# Patient Record
Sex: Male | Born: 2000 | Race: Black or African American | Hispanic: No | Marital: Single | Smoking: Current every day smoker
Health system: Southern US, Community
[De-identification: ages and names within clinical notes are randomized; demographics above are authoritative.]

## PROBLEM LIST (undated history)

## (undated) DIAGNOSIS — Z789 Other specified health status: Secondary | ICD-10-CM

## (undated) DIAGNOSIS — Z720 Tobacco use: Secondary | ICD-10-CM

## (undated) DIAGNOSIS — J45909 Unspecified asthma, uncomplicated: Secondary | ICD-10-CM

## (undated) DIAGNOSIS — F1090 Alcohol use, unspecified, uncomplicated: Secondary | ICD-10-CM

---
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
## 2019-11-18 ENCOUNTER — Other Ambulatory Visit: Payer: Self-pay

## 2019-11-18 ENCOUNTER — Emergency Department
Admission: EM | Admit: 2019-11-18 | Discharge: 2019-11-18 | Disposition: A | Discharge status: Home / Self Care | Payer: Medicaid Other | Attending: Emergency Medicine | Admitting: Emergency Medicine

## 2019-11-18 ENCOUNTER — Encounter: Payer: Self-pay | Admitting: Emergency Medicine

## 2019-11-18 DIAGNOSIS — F17210 Nicotine dependence, cigarettes, uncomplicated: Secondary | ICD-10-CM | POA: Insufficient documentation

## 2019-11-18 DIAGNOSIS — F10129 Alcohol abuse with intoxication, unspecified: Secondary | ICD-10-CM | POA: Insufficient documentation

## 2019-11-18 DIAGNOSIS — T402X1A Poisoning by other opioids, accidental (unintentional), initial encounter: Secondary | ICD-10-CM | POA: Diagnosis not present

## 2019-11-18 DIAGNOSIS — F10929 Alcohol use, unspecified with intoxication, unspecified: Secondary | ICD-10-CM

## 2019-11-18 DIAGNOSIS — T40601A Poisoning by unspecified narcotics, accidental (unintentional), initial encounter: Secondary | ICD-10-CM

## 2019-11-18 LAB — CBC
HCT: 43.4 % (ref 39.0–52.0)
Hemoglobin: 15.2 g/dL (ref 13.0–17.0)
MCH: 32.3 pg (ref 26.0–34.0)
MCHC: 35 g/dL (ref 30.0–36.0)
MCV: 92.1 fL (ref 80.0–100.0)
Platelets: 386 10*3/uL (ref 150–400)
RBC: 4.71 MIL/uL (ref 4.22–5.81)
RDW: 13.1 % (ref 11.5–15.5)
WBC: 20.6 10*3/uL — ABNORMAL HIGH (ref 4.0–10.5)
nRBC: 0 % (ref 0.0–0.2)

## 2019-11-18 LAB — COMPREHENSIVE METABOLIC PANEL
ALT: 23 U/L (ref 0–44)
AST: 29 U/L (ref 15–41)
Albumin: 4.6 g/dL (ref 3.5–5.0)
Alkaline Phosphatase: 70 U/L (ref 38–126)
Anion gap: 17 — ABNORMAL HIGH (ref 5–15)
BUN: 11 mg/dL (ref 6–20)
CO2: 20 mmol/L — ABNORMAL LOW (ref 22–32)
Calcium: 8.8 mg/dL — ABNORMAL LOW (ref 8.9–10.3)
Chloride: 105 mmol/L (ref 98–111)
Creatinine, Ser: 0.98 mg/dL (ref 0.61–1.24)
GFR calc Af Amer: >60 mL/min (ref >60)
GFR calc non Af Amer: >60 mL/min (ref >60)
Glucose, Bld: 84 mg/dL (ref 70–99)
Potassium: 3.6 mmol/L (ref 3.5–5.1)
Sodium: 142 mmol/L (ref 135–145)
Total Bilirubin: 0.7 mg/dL (ref 0.3–1.2)
Total Protein: 8.2 g/dL — ABNORMAL HIGH (ref 6.5–8.1)

## 2019-11-18 LAB — URINE DRUG SCREEN, QUALITATIVE (ARMC ONLY)
Amphetamines, Ur Screen: NOT DETECTED
Barbiturates, Ur Screen: NOT DETECTED
Benzodiazepine, Ur Scrn: NOT DETECTED
Cannabinoid 50 Ng, Ur ~~LOC~~: POSITIVE — AB
Cocaine Metabolite,Ur ~~LOC~~: NOT DETECTED
MDMA (Ecstasy)Ur Screen: NOT DETECTED
Methadone Scn, Ur: NOT DETECTED
Opiate, Ur Screen: NOT DETECTED
Phencyclidine (PCP) Ur S: NOT DETECTED
Tricyclic, Ur Screen: NOT DETECTED

## 2019-11-18 LAB — ETHANOL: Alcohol, Ethyl (B): 233 mg/dL — ABNORMAL HIGH (ref <10)

## 2019-11-18 LAB — ACETAMINOPHEN LEVEL: Acetaminophen (Tylenol), Serum: <10 ug/mL — ABNORMAL LOW (ref 10–30)

## 2019-11-18 LAB — SALICYLATE LEVEL: Salicylate Lvl: <7 mg/dL — ABNORMAL LOW (ref 7.0–30.0)

## 2019-11-18 NOTE — ED Notes (Signed)
PT  Discharged to police custody.;
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
## 2019-11-18 NOTE — ED Provider Notes (Signed)
[facility] Emergency Department Provider Note  ____________________________________________   First MD Initiated Contact with Patient 11/18/19 2332     (approximate)  I have reviewed the triage vital signs and the nursing notes.   HISTORY  Chief Complaint Medical Clearance    HPI Connor Williams is a 19 y.o. male presents to the emergency department in police custody after being found "passed out" on the reportedly by a trash can.  911 administered Narcan and the patient became responsive.  Patient is now alert and oriented and admits to taking what he thought to be a "Percocet 30".  Patient denies any preceding trauma.  Patient denies any complaints at present stating "I feel fine".        History reviewed. No pertinent past medical history.  Patient Active Problem List   Diagnosis Date Noted  . Altered mental status 09/23/2016    History reviewed. No pertinent surgical history.  Prior to Admission medications   Medication Sig Start Date End Date Taking? Authorizing Provider  ibuprofen (ADVIL,MOTRIN) 400 MG tablet Take 1 tablet (400 mg total) by mouth every 6 (six) hours as needed. Patient not taking: Reported on 09/23/2016 06/10/16   Pauline Aus, PA-C  magic mouthwash w/lidocaine SOLN Take 5 mLs by mouth 3 (three) times daily as needed for mouth pain. Swish and spit, do not swallow Patient not taking: Reported on 09/23/2016 06/10/16   Pauline Aus, PA-C  pseudoephedrine (SUDAFED) 60 MG tablet Take 1 tablet (60 mg total) by mouth every 6 (six) hours as needed for congestion. Patient not taking: Reported on 09/23/2016 06/10/16   Pauline Aus, PA-C    Allergies Patient has no known allergies.  Family History  Problem Relation Age of Onset  . Hypothyroidism Mother   . Hypertension Mother   . Alcohol abuse Father     Social History Social History   Tobacco Use  . Smoking status: Current Every Day Smoker    Packs/day: 1.00    Years: 3.00     Pack years: 3.00    Types: Cigarettes  . Smokeless tobacco: Never Used  Substance Use Topics  . Alcohol use: Yes  . Drug use: Yes    Types: Marijuana    Review of Systems Constitutional: No fever/chills Eyes: No visual changes. ENT: No sore throat. Cardiovascular: Denies chest pain. Respiratory: Denies shortness of breath. Gastrointestinal: No abdominal pain.  No nausea, no vomiting.  No diarrhea.  No constipation. Genitourinary: Negative for dysuria. Musculoskeletal: Negative for neck pain.  Negative for back pain. Integumentary: Negative for rash. Neurological: Negative for headaches, focal weakness or numbness. Psychiatric: Positive for opiate use ____________________________________________   PHYSICAL EXAM:  VITAL SIGNS: ED Triage Vitals  Enc Vitals Group     BP 11/18/19 1957 (!) 119/61     Pulse Rate 11/18/19 1957 91     Resp 11/18/19 1957 16     Temp 11/18/19 1957 98.7 F (37.1 C)     Temp Source 11/18/19 1957 Oral     SpO2 11/18/19 1957 97 %     Weight --      Height 11/18/19 2003 1.626 m (5\' 4" )     Head Circumference --      Peak Flow --      Pain Score 11/18/19 2003 0     Pain Loc --      Pain Edu? --      Excl. in GC? --     Constitutional: Alert and oriented.  Eyes:  Conjunctivae are normal.  Head: Atraumatic. Mouth/Throat: Patient is wearing a mask. Neck: No stridor.  No meningeal signs.   Cardiovascular: Normal rate, regular rhythm. Good peripheral circulation. Grossly normal heart sounds. Respiratory: Normal respiratory effort.  No retractions. Gastrointestinal: Soft and nontender. No distention.  Musculoskeletal: No lower extremity tenderness nor edema. No gross deformities of extremities. Neurologic:  Normal speech and language. No gross focal neurologic deficits are appreciated.  Skin:  Skin is warm, dry and intact. Psychiatric: Mood and affect are normal. Speech and behavior are normal.  ____________________________________________     LABS (all labs ordered are listed, but only abnormal results are displayed)  Labs Reviewed  COMPREHENSIVE METABOLIC PANEL - Abnormal; Notable for the following components:      Result Value   CO2 20 (*)    Calcium 8.8 (*)    Total Protein 8.2 (*)    Anion gap 17 (*)    All other components within normal limits  ETHANOL - Abnormal; Notable for the following components:   Alcohol, Ethyl (B) 233 (*)    All other components within normal limits  SALICYLATE LEVEL - Abnormal; Notable for the following components:   Salicylate Lvl <7.0 (*)    All other components within normal limits  ACETAMINOPHEN LEVEL - Abnormal; Notable for the following components:   Acetaminophen (Tylenol), Serum <10 (*)    All other components within normal limits  CBC - Abnormal; Notable for the following components:   WBC 20.6 (*)    All other components within normal limits  URINE DRUG SCREEN, QUALITATIVE ([facility] ONLY) - Abnormal; Notable for the following components:   Cannabinoid 50 Ng, Ur [place] POSITIVE (*)    All other components within normal limits    Procedures   ____________________________________________   INITIAL IMPRESSION / MDM / ASSESSMENT AND PLAN / ED COURSE  As part of my medical decision making, I reviewed the following data within the electronic MEDICAL RECORD NUMBER  19 year old male presented with above-stated history and physical exam following opiate overdose.  Patient denies any suicidal or homicidal ideation.  Laboratory data did reveal that patient had a EtOH level of 233.  Patient remained stable in the emergency department after many hours of observation and as such will be discharged in police custody. ____________________________________________  FINAL CLINICAL IMPRESSION(S) / ED DIAGNOSES  Final diagnoses:  Opiate overdose, accidental or unintentional, initial encounter (HCC)  Alcoholic intoxication with complication (HCC)     MEDICATIONS GIVEN DURING THIS  VISIT:  Medications - No data to display   ED Discharge Orders    None      *Please note:  Connor Williams was evaluated in Emergency Department on 11/18/2019 for the symptoms described in the history of present illness. He was evaluated in the context of the global COVID-19 pandemic, which necessitated consideration that the patient might be at risk for infection with the SARS-CoV-2 virus that causes COVID-19. Institutional protocols and algorithms that pertain to the evaluation of patients at risk for COVID-19 are in a state of rapid change based on information released by regulatory bodies including the CDC and federal and state organizations. These policies and algorithms were followed during the patient's care in the ED.  Some ED evaluations and interventions may be delayed as a result of limited staffing during and after the pandemic.*  Note:  This document was prepared using Dragon voice recognition software and may include unintentional dictation errors.   Darci Current, MD 11/18/19 2337

## 2020-01-20 ENCOUNTER — Other Ambulatory Visit: Payer: Self-pay

## 2020-01-20 ENCOUNTER — Emergency Department
Admission: EM | Admit: 2020-01-20 | Discharge: 2020-01-20 | Disposition: A | Discharge status: Home / Self Care | Payer: Medicaid Other | Attending: Emergency Medicine | Admitting: Emergency Medicine

## 2020-01-20 ENCOUNTER — Emergency Department: Payer: Medicaid Other

## 2020-01-20 DIAGNOSIS — J069 Acute upper respiratory infection, unspecified: Secondary | ICD-10-CM | POA: Diagnosis present

## 2020-01-20 DIAGNOSIS — J9801 Acute bronchospasm: Secondary | ICD-10-CM | POA: Diagnosis not present

## 2020-01-20 DIAGNOSIS — Z20822 Contact with and (suspected) exposure to covid-19: Secondary | ICD-10-CM | POA: Diagnosis not present

## 2020-01-20 DIAGNOSIS — Z7951 Long term (current) use of inhaled steroids: Secondary | ICD-10-CM | POA: Insufficient documentation

## 2020-01-20 DIAGNOSIS — F17210 Nicotine dependence, cigarettes, uncomplicated: Secondary | ICD-10-CM | POA: Diagnosis not present

## 2020-01-20 LAB — RESPIRATORY PANEL BY RT PCR (FLU A&B, COVID)
Influenza A by PCR: NEGATIVE
Influenza B by PCR: NEGATIVE
SARS Coronavirus 2 by RT PCR: NEGATIVE

## 2020-01-20 MED ORDER — METHYLPREDNISOLONE 4 MG PO TBPK: ORAL_TABLET | ORAL | 0 refills | Status: DC

## 2020-01-20 MED ORDER — BENZONATATE 100 MG PO CAPS: 100.0000 mg | ORAL_CAPSULE | Freq: Three times a day (TID) | ORAL | 0 refills | Status: DC | PRN

## 2020-01-20 MED ORDER — ALBUTEROL SULFATE HFA 108 (90 BASE) MCG/ACT IN AERS: 2.0000 | INHALATION_SPRAY | Freq: Four times a day (QID) | RESPIRATORY_TRACT | 2 refills | Status: DC | PRN

## 2020-01-20 NOTE — Discharge Instructions (Addendum)
Follow discharge care instruction take medication as directed.  Advised self quarantine pending results of COVID-19 test.  Test results will be available in the MyChart app later today.

## 2020-01-20 NOTE — ED Triage Notes (Signed)
Pt c/o cough with congestion and SOb for the past 2-3 nights, states he had same sx about 3 weeks ago and it went away

## 2020-01-20 NOTE — ED Provider Notes (Signed)
[facility] Emergency Department Provider Note   ____________________________________________   First MD Initiated Contact with Patient 01/20/20 1106     (approximate)  I have reviewed the triage vital signs and the nursing notes.   HISTORY  Chief Complaint URI    HPI Connor Williams is a 19 y.o. male patient presents with cough and congestion.  Patient also states shortness of breath secondary to mild wheezing.  Onset of complaint of 2 to 3 days ago.  Patient states similar complaint about 3 weeks ago went away without medical intervention.  Patient denies recent travel or known contact with COVID-19.  Patient not taken the vaccine.         History reviewed. No pertinent past medical history.  Patient Active Problem List   Diagnosis Date Noted  . Altered mental status 09/23/2016    History reviewed. No pertinent surgical history.  Prior to Admission medications   Medication Sig Start Date End Date Taking? Authorizing Provider  albuterol (VENTOLIN HFA) 108 (90 Base) MCG/ACT inhaler Inhale 2 puffs into the lungs every 6 (six) hours as needed for wheezing or shortness of breath. 01/20/20   Joni Reining, PA-C  benzonatate (TESSALON PERLES) 100 MG capsule Take 1 capsule (100 mg total) by mouth 3 (three) times daily as needed for cough. 01/20/20 01/19/21  Joni Reining, PA-C  ibuprofen (ADVIL,MOTRIN) 400 MG tablet Take 1 tablet (400 mg total) by mouth every 6 (six) hours as needed. Patient not taking: Reported on 09/23/2016 06/10/16   Pauline Aus, PA-C  magic mouthwash w/lidocaine SOLN Take 5 mLs by mouth 3 (three) times daily as needed for mouth pain. Swish and spit, do not swallow Patient not taking: Reported on 09/23/2016 06/10/16   Pauline Aus, PA-C  methylPREDNISolone (MEDROL DOSEPAK) 4 MG TBPK tablet Take Tapered dose as directed 01/20/20   Joni Reining, PA-C  pseudoephedrine (SUDAFED) 60 MG tablet Take 1 tablet (60 mg total) by mouth every 6  (six) hours as needed for congestion. Patient not taking: Reported on 09/23/2016 06/10/16   Pauline Aus, PA-C    Allergies Patient has no known allergies.  Family History  Problem Relation Age of Onset  . Hypothyroidism Mother   . Hypertension Mother   . Alcohol abuse Father     Social History Social History   Tobacco Use  . Smoking status: Current Every Day Smoker    Packs/day: 1.00    Years: 3.00    Pack years: 3.00    Types: Cigarettes  . Smokeless tobacco: Never Used  Substance Use Topics  . Alcohol use: Yes  . Drug use: Yes    Types: Marijuana    Review of Systems Constitutional: No fever/chills Eyes: No visual changes. ENT: No sore throat. Cardiovascular: Denies chest pain. Respiratory:  shortness of breath, mild wheezing, and cough. Gastrointestinal: No abdominal pain.  No nausea, no vomiting.  No diarrhea.  No constipation. Genitourinary: Negative for dysuria. Musculoskeletal: Negative for back pain. Skin: Negative for rash. Neurological: Negative for headaches, focal weakness or numbness.   ____________________________________________   PHYSICAL EXAM:  VITAL SIGNS: ED Triage Vitals [01/20/20 0810]  Enc Vitals Group     BP 132/84     Pulse Rate 94     Resp 17     Temp 97.8 F (36.6 C)     Temp Source Oral     SpO2 97 %     Weight 150 lb (68 kg)     Height 5'  5" (1.651 m)     Head Circumference      Peak Flow      Pain Score 6     Pain Loc      Pain Edu?      Excl. in GC?    Constitutional: Alert and oriented. Well appearing and in no acute distress. Eyes: Conjunctivae are normal. PERRL. EOMI. Head: Atraumatic. Nose: No congestion/rhinnorhea. Mouth/Throat: Mucous membranes are moist.  Oropharynx non-erythematous. Neck: No stridor.   Hematological/Lymphatic/Immunilogical: No cervical lymphadenopathy. Cardiovascular: Normal rate, regular rhythm. Grossly normal heart sounds.  Good peripheral circulation. Respiratory: Normal respiratory  effort.  No retractions. Lungs mild expiratory wheezing. Gastrointestinal: Soft and nontender. No distention. No abdominal bruits. No CVA tenderness. Skin:  Skin is warm, dry and intact. No rash noted.   ____________________________________________   LABS (all labs ordered are listed, but only abnormal results are displayed)  Labs Reviewed  RESPIRATORY PANEL BY RT PCR (FLU A&B, COVID)   ____________________________________________  EKG   ____________________________________________  RADIOLOGY  ED MD interpretation:    Official radiology report(s): DG Chest 2 View  Result Date: 01/20/2020 CLINICAL DATA:  Cough, congestion, shortness of breath, dyspnea, and chest pain for 2-3 nights worse when lying down, history of smoking EXAM: CHEST - 2 VIEW COMPARISON:  09/23/2016 FINDINGS: Normal heart size, mediastinal contours, and pulmonary vascularity. Central peribronchial thickening. No pulmonary infiltrate, pleural effusion, or pneumothorax. Bones unremarkable. IMPRESSION: Bronchitic changes without infiltrate. Electronically Signed   By: Ulyses Southward M.D.   On: 01/20/2020 09:39    ____________________________________________   PROCEDURES  Procedure(s) performed (including Critical Care):  Procedures   ____________________________________________   INITIAL IMPRESSION / ASSESSMENT AND PLAN / ED COURSE  As part of my medical decision making, I reviewed the following data within the electronic MEDICAL RECORD NUMBER     Patient presents with 2 to 3 days of mild wheezing and cough.  Discussed x-ray findings with patient showing bronchitic changes.  Patient advised follow discharge care instruction in self quarantine pending results of COVID-19 test.  Take medication as directed.       ____________________________________________   FINAL CLINICAL IMPRESSION(S) / ED DIAGNOSES  Final diagnoses:  Cough due to bronchospasm     ED Discharge Orders         Ordered    albuterol  (VENTOLIN HFA) 108 (90 Base) MCG/ACT inhaler  Every 6 hours PRN        01/20/20 1131    benzonatate (TESSALON PERLES) 100 MG capsule  3 times daily PRN        01/20/20 1131    methylPREDNISolone (MEDROL DOSEPAK) 4 MG TBPK tablet        01/20/20 1131          *Please note:  Connor Williams was evaluated in Emergency Department on 01/20/2020 for the symptoms described in the history of present illness. He was evaluated in the context of the global COVID-19 pandemic, which necessitated consideration that the patient might be at risk for infection with the SARS-CoV-2 virus that causes COVID-19. Institutional protocols and algorithms that pertain to the evaluation of patients at risk for COVID-19 are in a state of rapid change based on information released by regulatory bodies including the CDC and federal and state organizations. These policies and algorithms were followed during the patient's care in the ED.  Some ED evaluations and interventions may be delayed as a result of limited staffing during and the pandemic.*   Note:  This document was  prepared using Conservation officer, historic buildings and may include unintentional dictation errors.    Joni Reining, PA-C 01/20/20 1137    Chesley Noon, MD 01/22/20 [phone]

## 2020-03-08 ENCOUNTER — Emergency Department: Payer: Medicaid Other

## 2020-03-08 ENCOUNTER — Encounter: Payer: Self-pay | Admitting: Physician Assistant

## 2020-03-08 ENCOUNTER — Emergency Department
Admission: EM | Admit: 2020-03-08 | Discharge: 2020-03-08 | Disposition: A | Discharge status: Home / Self Care | Payer: Medicaid Other | Attending: Emergency Medicine | Admitting: Emergency Medicine

## 2020-03-08 ENCOUNTER — Other Ambulatory Visit: Payer: Self-pay

## 2020-03-08 DIAGNOSIS — J9801 Acute bronchospasm: Secondary | ICD-10-CM | POA: Insufficient documentation

## 2020-03-08 DIAGNOSIS — Z20822 Contact with and (suspected) exposure to covid-19: Secondary | ICD-10-CM | POA: Insufficient documentation

## 2020-03-08 DIAGNOSIS — F17210 Nicotine dependence, cigarettes, uncomplicated: Secondary | ICD-10-CM | POA: Insufficient documentation

## 2020-03-08 DIAGNOSIS — R079 Chest pain, unspecified: Secondary | ICD-10-CM | POA: Diagnosis present

## 2020-03-08 LAB — RESPIRATORY PANEL BY RT PCR (FLU A&B, COVID)
Influenza A by PCR: NEGATIVE
Influenza B by PCR: NEGATIVE
SARS Coronavirus 2 by RT PCR: NEGATIVE

## 2020-03-08 NOTE — ED Provider Notes (Signed)
[facility] Emergency Department Provider Note ____________________________________________  Time seen: 1345  I have reviewed the triage vital signs and the nursing notes.  HISTORY  Chief Complaint  Chest Pain and Shortness of Breath   HPI Connor Williams is a 19 y.o. male presents himself to the ED for evaluation of chest pain and shortness of breath that he believes is associated with his recent home improvement activities.  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  He reports he mostly feels the shortness of breath and chest pain when he goes to sleep.  He apparently took a dose of leftover prednisone today, reports improvement in his symptoms.  He presents without other complaint including fever, chills, abdominal pain, nausea, vomiting, or diarrhea.  Patient rates his pain a discomfort at a 2 out of 10 at this time.  He is a current everyday smoker.  History reviewed. No pertinent past medical history.  Patient Active Problem List   Diagnosis Date Noted  . Altered mental status 09/23/2016    History reviewed. No pertinent surgical history.  Prior to Admission medications   Medication Sig Start Date End Date Taking? Authorizing Provider  albuterol (VENTOLIN HFA) 108 (90 Base) MCG/ACT inhaler Inhale 2 puffs into the lungs every 6 (six) hours as needed for wheezing or shortness of breath. 01/20/20   Joni Reining, PA-C    Allergies Patient has no known allergies.  Family History  Problem Relation Age of Onset  . Hypothyroidism Mother   . Hypertension Mother   . Alcohol abuse Father     Social History Social History   Tobacco Use  . Smoking status: Current Every Day Smoker    Packs/day: 1.00    Years: 3.00    Pack years: 3.00    Types: Cigarettes  . Smokeless tobacco: Never Used  Substance Use Topics  . Alcohol use: Yes  . Drug use: Yes    Types: Marijuana    Review of Systems  Constitutional: Negative for fever. Eyes:  Negative for visual changes. ENT: Negative for sore throat. Cardiovascular: Negative for chest pain. Respiratory: Positive for shortness of breath. Gastrointestinal: Negative for abdominal pain, vomiting and diarrhea. Genitourinary: Negative for dysuria. Musculoskeletal: Negative for back pain. Skin: Negative for rash. Neurological: Negative for headaches, focal weakness or numbness. ____________________________________________  PHYSICAL EXAM:  VITAL SIGNS: ED Triage Vitals  Enc Vitals Group     BP 03/08/20 1247 133/77     Pulse Rate 03/08/20 1247 (!) 106     Resp 03/08/20 1247 18     Temp 03/08/20 1247 98.3 F (36.8 C)     Temp Source 03/08/20 1247 Oral     SpO2 03/08/20 1247 98 %     Weight 03/08/20 1248 149 lb 14.6 oz (68 kg)     Height 03/08/20 1248 5\' 5"  (1.651 m)     Head Circumference --      Peak Flow --      Pain Score 03/08/20 1247 2     Pain Loc --      Pain Edu? --      Excl. in GC? --     Constitutional: Alert and oriented. Well appearing and in no distress. Head: Normocephalic and atraumatic. Eyes: Conjunctivae are normal. PERRL. Normal extraocular movements Cardiovascular: Normal rate, regular rhythm. Normal distal pulses. Respiratory: Normal respiratory effort. No rales/rhonchi.  Mild expiratory wheeze noted bilaterally. Gastrointestinal: Soft and nontender. No distention. Musculoskeletal: Nontender with normal range of  motion in all extremities.  Neurologic:  Normal gait without ataxia. Normal speech and language. No gross focal neurologic deficits are appreciated. Skin:  Skin is warm, dry and intact. No rash noted. ____________________________________________   LABS (pertinent positives/negatives) Labs Reviewed  RESPIRATORY PANEL BY RT PCR (FLU A&B, COVID)  ____________________________________________  EKG  NSR 98 bpm PR Interval 140 ms QRS duration 98 ms Rightward axis No STEMI ____________________________________________    RADIOLOGY  CXR  Negative ____________________________________________  PROCEDURES  Procedures ____________________________________________  INITIAL IMPRESSION / ASSESSMENT AND PLAN / ED COURSE  DDX: CAP, COVID, bronchitis, asthma exacerbation  Patient with ED evaluation of several days of cough and shortness of breath, related to his activities at home which include some DIY projects.  Patient reports that symptoms did respond to a leftover steroid and some albuterol inhaler he had on hand.  He denies any interim fever, chills, or sweats.  He presents today for evaluation and concern of a possible bronchitis.  Symptoms are consistent with a mild bronchospasm with no radiologic evidence of any acute intra thoracic process.  Patient did agree to a Covid swab to be collected and will pend results.  He will use the albuterol has been previously prescribed as 2 refills are available to the patient.  He will continue with daily medications and follow-up with his primary provider.  He is encouraged to select a primary provider.  Return precautions have been reviewed and a work note was provided as requested.  Connor Williams was evaluated in Emergency Department on 03/08/2020 for the symptoms described in the history of present illness. He was evaluated in the context of the global COVID-19 pandemic, which necessitated consideration that the patient might be at risk for infection with the SARS-CoV-2 virus that causes COVID-19. Institutional protocols and algorithms that pertain to the evaluation of patients at risk for COVID-19 are in a state of rapid change based on information released by regulatory bodies including the CDC and federal and state organizations. These policies and algorithms were followed during the patient's care in the ED. ____________________________________________  FINAL CLINICAL IMPRESSION(S) / ED DIAGNOSES  Final diagnoses:  Bronchospasm, acute      Karmen Stabs, Charlesetta Ivory, PA-C 03/08/20 1547    Minna Antis, MD 03/09/20 2147

## 2020-03-08 NOTE — ED Notes (Signed)
Patient verbalizes understanding of discharge instructions. Opportunity for questioning and answers were provided. Armband removed by staff, pt discharged from ED. Ambulated out to lobby  

## 2020-03-08 NOTE — ED Triage Notes (Addendum)
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 Pt states that he mostly feels the CP and SOB when he goes to sleep. Pt took a prednisone today that he said helped his symptoms a lot. Pt NAD in triage.

## 2020-03-08 NOTE — Discharge Instructions (Signed)
Your exam and XR is normal at this time. Your symptoms are likely due to exposure to dust and debris. Use the inhaler as needed. Follow-up with a local community clinic as needed.

## 2020-08-04 ENCOUNTER — Emergency Department
Admission: EM | Admit: 2020-08-04 | Discharge: 2020-08-04 | Discharge status: Against Medical Advice | Payer: Medicaid Other | Attending: Emergency Medicine | Admitting: Emergency Medicine

## 2020-08-04 ENCOUNTER — Other Ambulatory Visit: Payer: Self-pay

## 2020-08-04 DIAGNOSIS — Y92410 Unspecified street and highway as the place of occurrence of the external cause: Secondary | ICD-10-CM | POA: Insufficient documentation

## 2020-08-04 DIAGNOSIS — S0181XA Laceration without foreign body of other part of head, initial encounter: Secondary | ICD-10-CM

## 2020-08-04 DIAGNOSIS — F17210 Nicotine dependence, cigarettes, uncomplicated: Secondary | ICD-10-CM | POA: Diagnosis not present

## 2020-08-04 DIAGNOSIS — S0990XA Unspecified injury of head, initial encounter: Secondary | ICD-10-CM | POA: Diagnosis present
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
## 2020-08-04 NOTE — ED Provider Notes (Signed)
[facility] Emergency Department Provider Note  ____________________________________________   Event Date/Time   First MD Initiated Contact with Patient 08/04/20 [phone]     (approximate)  I have reviewed the triage vital signs and the nursing notes.   HISTORY  Chief Complaint Medical Clearance    HPI Connor Williams is a 20 y.o. male presents emergency department in custody of the [facility] department.  Patient is refusing treatment.  States he wants to go and go to court.  He has lacerations on the face and he does not care.  States I do not care if they get fixed or not.    No past medical history on file.  Patient Active Problem List   Diagnosis Date Noted  . Altered mental status 09/23/2016    No past surgical history on file.  Prior to Admission medications   Medication Sig Start Date End Date Taking? Authorizing Provider  albuterol (VENTOLIN HFA) 108 (90 Base) MCG/ACT inhaler Inhale 2 puffs into the lungs every 6 (six) hours as needed for wheezing or shortness of breath. 01/20/20   Joni Reining, PA-C    Allergies Patient has no known allergies.  Family History  Problem Relation Age of Onset  . Hypothyroidism Mother   . Hypertension Mother   . Alcohol abuse Father     Social History Social History   Tobacco Use  . Smoking status: Current Every Day Smoker    Packs/day: 1.00    Years: 3.00    Pack years: 3.00    Types: Cigarettes  . Smokeless tobacco: Never Used  Substance Use Topics  . Alcohol use: Yes  . Drug use: Yes    Types: Marijuana    Review of Systems  Constitutional: No fever/chills Eyes: No visual changes. ENT: No sore throat. Respiratory: Denies cough Cardiovascular: Denies chest pain Gastrointestinal: Denies abdominal pain Genitourinary: Negative for dysuria. Musculoskeletal: Negative for back pain. Skin: Negative for rash. Psychiatric: no mood changes,      ____________________________________________   PHYSICAL EXAM:  VITAL SIGNS: ED Triage Vitals  Enc Vitals Group     BP 08/04/20 0911 123/71     Pulse Rate 08/04/20 0911 (!) 102     Resp 08/04/20 0911 20     Temp 08/04/20 0911 98 F (36.7 C)     Temp Source 08/04/20 0911 Oral     SpO2 08/04/20 0911 98 %     Weight 08/04/20 0912 155 lb (70.3 kg)     Height 08/04/20 0912 5\' 4"  (1.626 m)     Head Circumference --      Peak Flow --      Pain Score 08/04/20 0912 0     Pain Loc --      Pain Edu? --      Excl. in [place]? --     Constitutional: Alert and oriented. Well appearing and in no acute distress. Eyes: Conjunctivae are normal.  Head: Facial lacerations noted Nose: No congestion/rhinnorhea. Mouth/Throat: Mucous membranes are moist.  Large lip lacerations noted Neck:  supple no lymphadenopathy noted Cardiovascular: Normal rate, regular rhythm.  Respiratory: Normal respiratory effort.  No retractions,  GU: deferred Musculoskeletal: FROM all extremities, warm and well perfused Neurologic:  Normal speech and language.  Skin:  Skin is warm, dry. No rash noted. Psychiatric: Mood and affect are normal. Speech and behavior are normal.  ____________________________________________   LABS (all labs ordered are listed, but only abnormal results are displayed)  Labs Reviewed -  No data to display ____________________________________________   ____________________________________________  RADIOLOGY    ____________________________________________   PROCEDURES  Procedure(s) performed: No  Procedures    ____________________________________________   INITIAL IMPRESSION / ASSESSMENT AND PLAN / ED COURSE  Pertinent labs & imaging results that were available during my care of the patient were reviewed by me and considered in my medical decision making (see chart for details).   Patient is a 20 year old male with facial lacerations refusing treatment.  Patient is in  custody of the [facility] department.  I did explain to him that he could have a large infection especially with this being on his lips.  Patient is still refusing treatment.  States he just wants to go to court and he might come back later but he does not really care.  Did have another discussion with him about infection.  Patient is still refusing treatment and he was discharged AMA in the care of the [facility] department     Connor Williams was evaluated in Emergency Department on 08/04/2020 for the symptoms described in the history of present illness. He was evaluated in the context of the global COVID-19 pandemic, which necessitated consideration that the patient might be at risk for infection with the SARS-CoV-2 virus that causes COVID-19. Institutional protocols and algorithms that pertain to the evaluation of patients at risk for COVID-19 are in a state of rapid change based on information released by regulatory bodies including the CDC and federal and state organizations. These policies and algorithms were followed during the patient's care in the ED.    As part of my medical decision making, I reviewed the following data within the electronic MEDICAL RECORD NUMBER Nursing notes reviewed and incorporated, Old chart reviewed, Notes from prior ED visits and [place] Controlled Substance Database  ____________________________________________   FINAL CLINICAL IMPRESSION(S) / ED DIAGNOSES  Final diagnoses:  Facial laceration, initial encounter      NEW MEDICATIONS STARTED DURING THIS VISIT:  Discharge Medication List as of 08/04/2020  9:30 AM       Note:  This document was prepared using Dragon voice recognition software and may include unintentional dictation errors.    Faythe Ghee, PA-C 08/04/20 1005    Concha Se, MD 08/04/20 [phone]

## 2021-02-26 ENCOUNTER — Emergency Department: Payer: Medicaid Other

## 2021-02-26 ENCOUNTER — Encounter: Payer: Self-pay | Admitting: Emergency Medicine

## 2021-02-26 ENCOUNTER — Other Ambulatory Visit: Payer: Self-pay

## 2021-02-26 ENCOUNTER — Emergency Department
Admission: EM | Admit: 2021-02-26 | Discharge: 2021-02-26 | Disposition: A | Discharge status: Home / Self Care | Payer: Medicaid Other | Attending: Emergency Medicine | Admitting: Emergency Medicine

## 2021-02-26 DIAGNOSIS — W228XXA Striking against or struck by other objects, initial encounter: Secondary | ICD-10-CM | POA: Diagnosis not present

## 2021-02-26 DIAGNOSIS — S7000XA Contusion of unspecified hip, initial encounter: Secondary | ICD-10-CM

## 2021-02-26 DIAGNOSIS — F17210 Nicotine dependence, cigarettes, uncomplicated: Secondary | ICD-10-CM | POA: Insufficient documentation

## 2021-02-26 DIAGNOSIS — S79911A Unspecified injury of right hip, initial encounter: Secondary | ICD-10-CM | POA: Diagnosis present

## 2021-02-26 DIAGNOSIS — S7002XA Contusion of left hip, initial encounter: Secondary | ICD-10-CM | POA: Diagnosis not present

## 2021-02-26 DIAGNOSIS — S7001XA Contusion of right hip, initial encounter: Secondary | ICD-10-CM | POA: Insufficient documentation

## 2021-02-26 LAB — URINALYSIS, ROUTINE W REFLEX MICROSCOPIC
Bacteria, UA: NONE SEEN
Bilirubin Urine: NEGATIVE
Glucose, UA: NEGATIVE mg/dL
Ketones, ur: NEGATIVE mg/dL
Leukocytes,Ua: NEGATIVE
Nitrite: NEGATIVE
Protein, ur: 100 mg/dL — AB
Specific Gravity, Urine: 1.014 (ref 1.005–1.030)
pH: 6 (ref 5.0–8.0)

## 2021-02-26 MED ORDER — KETOROLAC TROMETHAMINE 30 MG/ML IJ SOLN
30.0000 mg | Freq: Once | INTRAMUSCULAR | Status: AC
  Administered 2021-02-26: 30 mg via INTRAMUSCULAR
  Filled 2021-02-26: qty 1

## 2021-02-26 NOTE — ED Notes (Signed)
Patient is agitated, states nothing is being done for him. Patient states he wants a phone so he can call for a ride home. Patient was informed that the color of urine was concerning, so a urinalysis was ordered. Medication was being held until the urinalysis was reported.

## 2021-02-26 NOTE — ED Notes (Signed)
Patient refused vital signs 

## 2021-02-26 NOTE — ED Notes (Signed)
Patient yelled for help. Patient was found on the floor off the head of the stretcher, both rails were up and call light was on the rail. Patient stated he needed to go to the bathroom. Patient was encouraged to get back on the stretcher. Patient refused to put weight on left leg, but was able to repositioned self on the stretcher. Patient was given the ED phone to call for a ride home upon discharge. Bolivar Haw, PA-C was present. Patient denies additional pain other than hips.
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
## 2021-02-26 NOTE — ED Notes (Signed)
Patient is calling other people for a ride.
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
## 2021-02-26 NOTE — ED Notes (Signed)
Patient apologized for "being a pain in the ass." Patient was given a glass of water upon discharge. Patient was able to reposition self on stretcher and assist to sit in the wheelchair.

## 2021-02-26 NOTE — ED Notes (Signed)
Patient has spoken with his mother on the phone for a ride. Patient is agitated, swearing. Mother is trying to get off work to come get the patient.

## 2021-02-26 NOTE — Discharge Instructions (Signed)
Follow-up with your primary care doctor at [facility] health if any continued problems or concerns.  You were given an injection of Toradol which should help with your soreness and hip pain.  You may use ice to your hips as needed for discomfort.  Frequently getting up to walk will help with some of the soreness in your hips.  You may continue with over-the-counter medications such as Tylenol or ibuprofen at home as needed.

## 2021-02-26 NOTE — ED Notes (Addendum)
RN in POD D waiting to get pt. Pt laying on the floor with a pillow. When asked how he got on the floor pt states, "I fell." RN trying to assist pt back in wheelchair but pt stating he can't do it. RN to get assistance to get pt back into wheelchair. Pt denies hitting his head or LOC. MD made aware.

## 2021-03-29 ENCOUNTER — Inpatient Hospital Stay
Admission: EM | Admit: 2021-03-29 | Discharge: 2021-04-02 | DRG: 871 | Payer: Medicaid Other | Attending: Internal Medicine | Admitting: Internal Medicine

## 2021-03-29 ENCOUNTER — Encounter: Payer: Self-pay | Admitting: Emergency Medicine

## 2021-03-29 ENCOUNTER — Inpatient Hospital Stay: Payer: Medicaid Other

## 2021-03-29 ENCOUNTER — Emergency Department: Payer: Medicaid Other

## 2021-03-29 DIAGNOSIS — A419 Sepsis, unspecified organism: Principal | ICD-10-CM

## 2021-03-29 DIAGNOSIS — Z720 Tobacco use: Secondary | ICD-10-CM | POA: Diagnosis present

## 2021-03-29 DIAGNOSIS — J45909 Unspecified asthma, uncomplicated: Secondary | ICD-10-CM | POA: Diagnosis present

## 2021-03-29 DIAGNOSIS — D649 Anemia, unspecified: Secondary | ICD-10-CM | POA: Diagnosis present

## 2021-03-29 DIAGNOSIS — Z20822 Contact with and (suspected) exposure to covid-19: Secondary | ICD-10-CM | POA: Diagnosis present

## 2021-03-29 DIAGNOSIS — R6521 Severe sepsis with septic shock: Secondary | ICD-10-CM | POA: Diagnosis present

## 2021-03-29 DIAGNOSIS — Z9889 Other specified postprocedural states: Secondary | ICD-10-CM

## 2021-03-29 DIAGNOSIS — Z811 Family history of alcohol abuse and dependence: Secondary | ICD-10-CM | POA: Diagnosis not present

## 2021-03-29 DIAGNOSIS — R21 Rash and other nonspecific skin eruption: Secondary | ICD-10-CM | POA: Diagnosis present

## 2021-03-29 DIAGNOSIS — E871 Hypo-osmolality and hyponatremia: Secondary | ICD-10-CM | POA: Diagnosis present

## 2021-03-29 DIAGNOSIS — J189 Pneumonia, unspecified organism: Secondary | ICD-10-CM | POA: Diagnosis not present

## 2021-03-29 DIAGNOSIS — Z22322 Carrier or suspected carrier of Methicillin resistant Staphylococcus aureus: Secondary | ICD-10-CM

## 2021-03-29 DIAGNOSIS — J90 Pleural effusion, not elsewhere classified: Secondary | ICD-10-CM | POA: Diagnosis not present

## 2021-03-29 DIAGNOSIS — J155 Pneumonia due to Escherichia coli: Secondary | ICD-10-CM | POA: Diagnosis present

## 2021-03-29 DIAGNOSIS — Z789 Other specified health status: Secondary | ICD-10-CM | POA: Diagnosis present

## 2021-03-29 DIAGNOSIS — E876 Hypokalemia: Secondary | ICD-10-CM | POA: Diagnosis present

## 2021-03-29 DIAGNOSIS — Z23 Encounter for immunization: Secondary | ICD-10-CM | POA: Diagnosis not present

## 2021-03-29 DIAGNOSIS — Z8249 Family history of ischemic heart disease and other diseases of the circulatory system: Secondary | ICD-10-CM

## 2021-03-29 DIAGNOSIS — F17210 Nicotine dependence, cigarettes, uncomplicated: Secondary | ICD-10-CM | POA: Diagnosis present

## 2021-03-29 DIAGNOSIS — R000 Tachycardia, unspecified: Secondary | ICD-10-CM | POA: Diagnosis not present

## 2021-03-29 DIAGNOSIS — J869 Pyothorax without fistula: Secondary | ICD-10-CM | POA: Diagnosis present

## 2021-03-29 DIAGNOSIS — R0602 Shortness of breath: Secondary | ICD-10-CM

## 2021-03-29 HISTORY — DX: Unspecified asthma, uncomplicated: J45.909

## 2021-03-29 HISTORY — DX: Other specified health status: Z78.9

## 2021-03-29 HISTORY — DX: Tobacco use: Z72.0

## 2021-03-29 HISTORY — DX: Alcohol use, unspecified, uncomplicated: F10.90

## 2021-03-29 LAB — BASIC METABOLIC PANEL
Anion gap: 9 (ref 5–15)
Anion gap: 7 (ref 5–15)
BUN: 13 mg/dL (ref 6–20)
BUN: 10 mg/dL (ref 6–20)
CO2: 26 mmol/L (ref 22–32)
CO2: 25 mmol/L (ref 22–32)
Calcium: 8.5 mg/dL — ABNORMAL LOW (ref 8.9–10.3)
Calcium: 7.8 mg/dL — ABNORMAL LOW (ref 8.9–10.3)
Chloride: 93 mmol/L — ABNORMAL LOW (ref 98–111)
Chloride: 102 mmol/L (ref 98–111)
Creatinine, Ser: 1.01 mg/dL (ref 0.61–1.24)
Creatinine, Ser: 0.95 mg/dL (ref 0.61–1.24)
GFR, Estimated: >60 mL/min (ref >60)
GFR, Estimated: >60 mL/min (ref >60)
Glucose, Bld: 103 mg/dL — ABNORMAL HIGH (ref 70–99)
Glucose, Bld: 119 mg/dL — ABNORMAL HIGH (ref 70–99)
Potassium: 3.8 mmol/L (ref 3.5–5.1)
Potassium: 3.3 mmol/L — ABNORMAL LOW (ref 3.5–5.1)
Sodium: 128 mmol/L — ABNORMAL LOW (ref 135–145)
Sodium: 134 mmol/L — ABNORMAL LOW (ref 135–145)

## 2021-03-29 LAB — CBC WITH DIFFERENTIAL/PLATELET
Abs Immature Granulocytes: 0.94 10*3/uL — ABNORMAL HIGH (ref 0.00–0.07)
Basophils Absolute: 0.2 10*3/uL — ABNORMAL HIGH (ref 0.0–0.1)
Basophils Relative: 0 %
Eosinophils Absolute: 0.6 10*3/uL — ABNORMAL HIGH (ref 0.0–0.5)
Eosinophils Relative: 1 %
HCT: 35.8 % — ABNORMAL LOW (ref 39.0–52.0)
Hemoglobin: 12.2 g/dL — ABNORMAL LOW (ref 13.0–17.0)
Immature Granulocytes: 2 %
Lymphocytes Relative: 3 %
Lymphs Abs: 1.1 10*3/uL (ref 0.7–4.0)
MCH: 29 pg (ref 26.0–34.0)
MCHC: 34.1 g/dL (ref 30.0–36.0)
MCV: 85.2 fL (ref 80.0–100.0)
Monocytes Absolute: 2.8 10*3/uL — ABNORMAL HIGH (ref 0.1–1.0)
Monocytes Relative: 6 %
Neutro Abs: 39.2 10*3/uL — ABNORMAL HIGH (ref 1.7–7.7)
Neutrophils Relative %: 88 %
Platelets: 485 10*3/uL — ABNORMAL HIGH (ref 150–400)
RBC: 4.2 MIL/uL — ABNORMAL LOW (ref 4.22–5.81)
RDW: 13.4 % (ref 11.5–15.5)
Smear Review: NORMAL
WBC: 44.8 10*3/uL — ABNORMAL HIGH (ref 4.0–10.5)
nRBC: 0 % (ref 0.0–0.2)

## 2021-03-29 LAB — URINE DRUG SCREEN, QUALITATIVE (ARMC ONLY)
Amphetamines, Ur Screen: NOT DETECTED
Barbiturates, Ur Screen: NOT DETECTED
Benzodiazepine, Ur Scrn: NOT DETECTED
Cannabinoid 50 Ng, Ur ~~LOC~~: NOT DETECTED
Cocaine Metabolite,Ur ~~LOC~~: NOT DETECTED
MDMA (Ecstasy)Ur Screen: NOT DETECTED
Methadone Scn, Ur: NOT DETECTED
Opiate, Ur Screen: NOT DETECTED
Phencyclidine (PCP) Ur S: NOT DETECTED
Tricyclic, Ur Screen: NOT DETECTED

## 2021-03-29 LAB — LACTATE DEHYDROGENASE: LDH: 138 U/L (ref 98–192)

## 2021-03-29 LAB — CBC
HCT: 35.3 % — ABNORMAL LOW (ref 39.0–52.0)
Hemoglobin: 12.3 g/dL — ABNORMAL LOW (ref 13.0–17.0)
MCH: 29.5 pg (ref 26.0–34.0)
MCHC: 34.8 g/dL (ref 30.0–36.0)
MCV: 84.7 fL (ref 80.0–100.0)
Platelets: 461 10*3/uL — ABNORMAL HIGH (ref 150–400)
RBC: 4.17 MIL/uL — ABNORMAL LOW (ref 4.22–5.81)
RDW: 13.3 % (ref 11.5–15.5)
WBC: 44.8 10*3/uL — ABNORMAL HIGH (ref 4.0–10.5)
nRBC: 0 % (ref 0.0–0.2)

## 2021-03-29 LAB — RESP PANEL BY RT-PCR (FLU A&B, COVID) ARPGX2
Influenza A by PCR: NEGATIVE
Influenza B by PCR: NEGATIVE
SARS Coronavirus 2 by RT PCR: NEGATIVE

## 2021-03-29 LAB — PROTIME-INR
INR: 1.8 — ABNORMAL HIGH (ref 0.8–1.2)
Prothrombin Time: 20.5 seconds — ABNORMAL HIGH (ref 11.4–15.2)

## 2021-03-29 LAB — OSMOLALITY: Osmolality: 285 mOsm/kg (ref 275–295)

## 2021-03-29 LAB — BODY FLUID CELL COUNT WITH DIFFERENTIAL
Eos, Fluid: 1 %
Lymphs, Fluid: 8 %
Monocyte-Macrophage-Serous Fluid: 1 %
Neutrophil Count, Fluid: 90 %
Total Nucleated Cell Count, Fluid: 3117 cu mm

## 2021-03-29 LAB — TROPONIN I (HIGH SENSITIVITY)
Troponin I (High Sensitivity): 9 ng/L (ref <18)
Troponin I (High Sensitivity): 9 ng/L (ref <18)

## 2021-03-29 LAB — ALBUMIN: Albumin: 2.3 g/dL — ABNORMAL LOW (ref 3.5–5.0)

## 2021-03-29 LAB — PROTEIN, PLEURAL OR PERITONEAL FLUID: Total protein, fluid: 5.4 g/dL

## 2021-03-29 LAB — LACTIC ACID, PLASMA
Lactic Acid, Venous: 1.8 mmol/L (ref 0.5–1.9)
Lactic Acid, Venous: 1.8 mmol/L (ref 0.5–1.9)
Lactic Acid, Venous: 1.1 mmol/L (ref 0.5–1.9)

## 2021-03-29 LAB — LACTATE DEHYDROGENASE, PLEURAL OR PERITONEAL FLUID: LD, Fluid: 909 U/L — ABNORMAL HIGH (ref 3–23)

## 2021-03-29 LAB — PROCALCITONIN: Procalcitonin: 22.48 ng/mL

## 2021-03-29 LAB — OSMOLALITY, URINE: Osmolality, Ur: 162 mOsm/kg — ABNORMAL LOW (ref 300–900)

## 2021-03-29 LAB — SODIUM, URINE, RANDOM: Sodium, Ur: 17 mmol/L

## 2021-03-29 LAB — GLUCOSE, PLEURAL OR PERITONEAL FLUID: Glucose, Fluid: <20 mg/dL

## 2021-03-29 LAB — CBG MONITORING, ED: Glucose-Capillary: 86 mg/dL (ref 70–99)

## 2021-03-29 LAB — HIV ANTIBODY (ROUTINE TESTING W REFLEX): HIV Screen 4th Generation wRfx: NONREACTIVE

## 2021-03-29 MED ORDER — ACETAMINOPHEN 325 MG PO TABS
650.0000 mg | ORAL_TABLET | Freq: Four times a day (QID) | ORAL | Status: DC | PRN
  Administered 2021-03-29: 650 mg via ORAL
  Filled 2021-03-29: qty 2

## 2021-03-29 MED ORDER — SODIUM CHLORIDE 0.9 % IV SOLN
2.0000 g | Freq: Three times a day (TID) | INTRAVENOUS | Status: DC
  Administered 2021-03-29 – 2021-03-31 (×7): 2 g via INTRAVENOUS
  Filled 2021-03-29 (×7): qty 2

## 2021-03-29 MED ORDER — LIDOCAINE-EPINEPHRINE 2 %-1:100000 IJ SOLN
20.0000 mL | Freq: Once | INTRAMUSCULAR | Status: AC
  Administered 2021-03-29: 20 mL via INTRADERMAL

## 2021-03-29 MED ORDER — OXYCODONE-ACETAMINOPHEN 5-325 MG PO TABS
1.0000 | ORAL_TABLET | ORAL | Status: DC | PRN
  Administered 2021-03-29 (×2): 1 via ORAL
  Filled 2021-03-29 (×2): qty 1

## 2021-03-29 MED ORDER — KETOROLAC TROMETHAMINE 30 MG/ML IJ SOLN
15.0000 mg | Freq: Once | INTRAMUSCULAR | Status: AC
  Administered 2021-03-29: 15 mg via INTRAVENOUS
  Filled 2021-03-29: qty 1

## 2021-03-29 MED ORDER — NOREPINEPHRINE 4 MG/250ML-% IV SOLN
2.0000 ug/min | INTRAVENOUS | Status: DC
  Administered 2021-03-30: 2 ug/min via INTRAVENOUS
  Filled 2021-03-29: qty 250

## 2021-03-29 MED ORDER — OXYCODONE HCL 5 MG PO TABS
10.0000 mg | ORAL_TABLET | ORAL | Status: DC | PRN
  Administered 2021-03-30 – 2021-04-02 (×8): 10 mg via ORAL
  Filled 2021-03-29 (×8): qty 2

## 2021-03-29 MED ORDER — SODIUM CHLORIDE 0.9 % IV SOLN
250.0000 mL | INTRAVENOUS | Status: DC
  Administered 2021-03-31: 250 mL via INTRAVENOUS

## 2021-03-29 MED ORDER — FOLIC ACID 1 MG PO TABS
1.0000 mg | ORAL_TABLET | Freq: Every day | ORAL | Status: DC
  Administered 2021-03-29 – 2021-04-02 (×5): 1 mg via ORAL
  Filled 2021-03-29 (×5): qty 1

## 2021-03-29 MED ORDER — LACTATED RINGERS IV BOLUS
1000.0000 mL | Freq: Once | INTRAVENOUS | Status: AC
  Administered 2021-03-29: 1000 mL via INTRAVENOUS

## 2021-03-29 MED ORDER — LACTATED RINGERS IV BOLUS
500.0000 mL | Freq: Once | INTRAVENOUS | Status: AC
  Administered 2021-03-29: 500 mL via INTRAVENOUS

## 2021-03-29 MED ORDER — SODIUM CHLORIDE 0.9 % IV SOLN
1.0000 g | Freq: Once | INTRAVENOUS | Status: AC
  Administered 2021-03-29: 1 g via INTRAVENOUS
  Filled 2021-03-29: qty 10

## 2021-03-29 MED ORDER — LIDOCAINE HCL (PF) 2 % IJ SOLN
10.0000 mL | Freq: Once | INTRAMUSCULAR | Status: DC
  Filled 2021-03-29: qty 10

## 2021-03-29 MED ORDER — MIDAZOLAM HCL 2 MG/2ML IJ SOLN
2.0000 mg | Freq: Once | INTRAMUSCULAR | Status: AC
  Administered 2021-03-29: 2 mg via INTRAVENOUS
  Filled 2021-03-29: qty 2

## 2021-03-29 MED ORDER — VANCOMYCIN HCL 1500 MG/300ML IV SOLN
1500.0000 mg | Freq: Once | INTRAVENOUS | Status: AC
  Administered 2021-03-29: 1500 mg via INTRAVENOUS
  Filled 2021-03-29: qty 300

## 2021-03-29 MED ORDER — KETOROLAC TROMETHAMINE 30 MG/ML IJ SOLN
30.0000 mg | Freq: Four times a day (QID) | INTRAMUSCULAR | Status: DC
  Administered 2021-03-29 – 2021-03-31 (×6): 30 mg via INTRAVENOUS
  Filled 2021-03-29 (×6): qty 1

## 2021-03-29 MED ORDER — IOHEXOL 300 MG/ML  SOLN
75.0000 mL | Freq: Once | INTRAMUSCULAR | Status: AC | PRN
  Administered 2021-03-29: 75 mL via INTRAVENOUS

## 2021-03-29 MED ORDER — ONDANSETRON HCL 4 MG/2ML IJ SOLN
4.0000 mg | Freq: Three times a day (TID) | INTRAMUSCULAR | Status: DC | PRN
  Administered 2021-03-29: 4 mg via INTRAVENOUS
  Filled 2021-03-29: qty 2

## 2021-03-29 MED ORDER — FENTANYL CITRATE PF 50 MCG/ML IJ SOSY
50.0000 ug | PREFILLED_SYRINGE | Freq: Once | INTRAMUSCULAR | Status: AC
  Administered 2021-03-29: 50 ug via INTRAVENOUS
  Filled 2021-03-29 (×2): qty 1

## 2021-03-29 MED ORDER — SODIUM CHLORIDE 0.9 % IV SOLN: INTRAVENOUS | Status: DC

## 2021-03-29 MED ORDER — DM-GUAIFENESIN ER 30-600 MG PO TB12: 1.0000 | ORAL_TABLET | Freq: Two times a day (BID) | ORAL | Status: DC | PRN

## 2021-03-29 MED ORDER — ALBUTEROL SULFATE (2.5 MG/3ML) 0.083% IN NEBU
2.5000 mg | INHALATION_SOLUTION | RESPIRATORY_TRACT | Status: DC | PRN
  Administered 2021-03-29: 2.5 mg via RESPIRATORY_TRACT
  Filled 2021-03-29: qty 3

## 2021-03-29 MED ORDER — VANCOMYCIN HCL 1000 MG/200ML IV SOLN
1000.0000 mg | Freq: Two times a day (BID) | INTRAVENOUS | Status: DC
  Administered 2021-03-29: 1000 mg via INTRAVENOUS
  Filled 2021-03-29 (×2): qty 200

## 2021-03-29 MED ORDER — NICOTINE 21 MG/24HR TD PT24
21.0000 mg | MEDICATED_PATCH | Freq: Every day | TRANSDERMAL | Status: DC
  Administered 2021-03-29 – 2021-04-02 (×5): 21 mg via TRANSDERMAL
  Filled 2021-03-29 (×5): qty 1

## 2021-03-29 MED ORDER — LACTATED RINGERS IV SOLN: INTRAVENOUS | Status: DC

## 2021-03-29 MED ORDER — METRONIDAZOLE 500 MG/100ML IV SOLN
500.0000 mg | Freq: Two times a day (BID) | INTRAVENOUS | Status: DC
  Administered 2021-03-29 – 2021-03-31 (×5): 500 mg via INTRAVENOUS
  Filled 2021-03-29 (×5): qty 100

## 2021-03-29 MED ORDER — ALBUTEROL SULFATE HFA 108 (90 BASE) MCG/ACT IN AERS: 2.0000 | INHALATION_SPRAY | RESPIRATORY_TRACT | Status: DC | PRN

## 2021-03-29 MED ORDER — SODIUM CHLORIDE 0.9 % IV BOLUS
500.0000 mL | Freq: Once | INTRAVENOUS | Status: AC
  Administered 2021-03-29: 500 mL via INTRAVENOUS

## 2021-03-29 MED ORDER — ACETAMINOPHEN 325 MG PO TABS
650.0000 mg | ORAL_TABLET | Freq: Four times a day (QID) | ORAL | Status: DC
  Administered 2021-03-30 – 2021-03-31 (×6): 650 mg via ORAL
  Filled 2021-03-29 (×7): qty 2

## 2021-03-29 MED ORDER — THIAMINE HCL 100 MG PO TABS
100.0000 mg | ORAL_TABLET | Freq: Every day | ORAL | Status: DC
  Administered 2021-03-29 – 2021-04-02 (×5): 100 mg via ORAL
  Filled 2021-03-29 (×5): qty 1

## 2021-03-29 MED ORDER — HYDROMORPHONE HCL 1 MG/ML IJ SOLN
1.0000 mg | INTRAMUSCULAR | Status: DC | PRN
  Administered 2021-03-30: 1 mg via INTRAVENOUS
  Filled 2021-03-29: qty 1

## 2021-03-29 NOTE — ED Notes (Signed)
MD. Messaged regarding pt HR. Will draw lactic acid and send.

## 2021-03-29 NOTE — ED Triage Notes (Signed)
[facility]/o left sided chest pain that radiates into back x1 week. Pt reports intermittent SOB but denies currently. Pt denies cough.

## 2021-03-29 NOTE — Consult Note (Signed)
NAME:  Connor Williams, MRN:  [id number], DOB:  12-Jan-2001, LOS: 0 ADMISSION DATE:  03/29/2021, CONSULTATION DATE:  03/29/2021 REFERRING MD:  Dr. Clyde Lundborg, CHIEF COMPLAINT:  Chest pain and Shortness of Breath   Brief Pt Description / Synopsis:  20 y.o. Male admitted with Sepsis due to Community Acquired Pneumonia with large Left sided partially loculated pleural effusion concerning for Empyema.  PCCM consulted for chest tube placement. ID following.  May require transfer to [facility] for CT Surgery and Decortication.  History of Present Illness:  Connor Williams is a 20 year old male with a past medical history significant for asthma, tobacco abuse, and alcohol use who presented to [facility] ED on 03/29/2021 due to complaints of chest pain and shortness of breath.    Patient has been incarcerated for the past 3 weeks, and reports he developed chest pain approximately 2 weeks ago.  The chest pain is described as located in the left side, persistent, sharp, pleuritic, and aggravated by deep breaths.  He also reported associated shortness of breath and mild intermittent nonproductive cough.  He reports that approximately 2 days ago he woke up drenched in sweat.  He denies dizziness, palpitations, abdominal pain, nausea, vomiting, diarrhea, dysuria.  He denies IV drug use, does report drinking 6 beers every day prior to going to jail.  ED Course: Initial vital signs: Temperature 99.3 orally, respiratory rate 36, pulse 128, blood pressure 119/69, SPO2 98% on room air Significant Labs: Sodium 128, WBC 44.8 with neutrophilia, hemoglobin 12.3, hematocrit 35.3, platelets 461 serum acetaminophen and less than 10, salicylates less than 7, lactic acid 1.8 SARS-CoV-2 and influenza PCR negative Urine drug screen is negative Imaging: Chest x-ray>>Large left pleural effusion with opacified or obscured underlying lung. Suggest CT with contrast.. CT chest>>Large partially loculated left pleural effusion of unknown etiology.  Compressive atelectasis versus pneumonia in the left lower lobe and lingula. Medications given: 2.5 L of LR boluses, ceftriaxone x1 dose, cefepime, Flagyl, vancomycin  Thoracentesis was performed by IR which yielded 150 cc of yellow colored fluid (effusion noted to be severely loculated and therefore unable to drain fully).  Preliminary pleural fluid analysis is consistent for exudate.  PCCM was consulted for chest tube placement.  CT surgery at [facility] has been contacted, patient may need transfer to [facility] for decortication.  Pertinent  Medical History  Asthma Tobacco Abuse Alcohol use  Micro Data:  03/29/2021: SARS-CoV-2 and influenza PCR>> negative 03/29/2021: Blood culture x2>> 03/29/2021: Pleural fluid>> 03/29/2021: Acid-fast smear pleural fluid>> 03/29/2021: Acid-fast culture from pleural fluid>> 03/29/2021: Strep pneumo urinary antigen>> 03/29/2021: Legionella urinary antigen>>  Antimicrobials:  Ceftriaxone 12/6 x1 dose Cefepime 12/6 Flagyl 12/6>> Vancomycin 12/6>>  Significant Hospital Events: Including procedures, antibiotic start and stop dates in addition to other pertinent events   03/29/21: Presented to ED with SOB and chest pain.  Found to have Pneumonia with large left partially pleural effusion suspicious for Empyema. Thoracentesis performed by IR. PCCM consulted for chest tube placement.  ID consulted  Interim History / Subjective:  -Patient presented to ED due to complaints of shortness of breath and chest pain -CT chest with large partially loculated left pleural effusion -Thoracentesis performed by IR, preliminary results consistent with exudate -PCCM consulted due to need for chest tube placement -Bedside ultrasound performed by Dr. Merrily Pew consistent with empyema -Case being discussed with CT Surgeon at [facility], may need transfer for decortication -ID consulted -Febrile (T-max 101.8), hemodynamically stable, on room air -Pt presented with SOB and chest  pain ~ reports chest pain is gone and SOB has improved since Thoracentesis  Objective   Blood pressure (!) 101/59, pulse (!) 135, temperature 99 F (37.2 C), temperature source Oral, resp. rate 18, weight 65.8 kg, SpO2 97 %.        Intake/Output Summary (Last 24 hours) at 03/29/2021 1456 Last data filed at 03/29/2021 1415 Gross per 24 hour  Intake 1196.25 ml  Output --  Net 1196.25 ml   Filed Weights   03/29/21 0900  Weight: 65.8 kg    Examination: General: Acutely ill-appearing male, laying in bed, on room air, no acute distress HENT: Atraumatic, normocephalic, neck supple, no JVD Lungs: Clear breath sounds to the right, coarse breath sounds to the left, even, nonlabored Cardiovascular: Tachycardia, regular rhythm (sinus tachycardia on telemetry), no murmurs, rubs, gallops, 2+ distal pulses Abdomen: Soft, nontender, nondistended, no guarding rebound tenderness, bowel sounds positive x4 Extremities: No deformities, no edema, normal bulk and tone Neuro: Awake and alert, oriented x4, follows commands, no focal deficits, speech clear, pupils PERRLA GU: Deferred  Resolved Hospital Problem list     Assessment & Plan:   Sepsis in the setting of Pneumonia with Large Left partially loculated Pleural Effusion concerning for EMPYEMA (Meets SIRS Criteria: WBC 44.8, HR 130's, RR 37) -Monitor fever curve -Trend WBC's & Procalcitonin -Follow cultures as above -ID consulted, appreciate input -Continue empiric Cefepime, Flagyl, Vancomycin pending cultures & sensitivities ~ will defer to ID -Thoracentesis performed 12/5 by IR ~ yielded 150 cc of yellow colored fluid (effusion noted to be severely loculated and therefore unable to drain fully) ~preliminary analysis consistent with EXUDATE -Bedside US performed by Dr. Tacy Learn, consistent with Empyema (see image below) -Plan for chest tube placement by Dr. Tacy Learn -Case being discussed with CT Surgeon at [facility], may need transfer for  decortication      Best Practice (right click and "Reselect all SmartList Selections" daily)   Diet/type: Regular consistency (see orders) DVT prophylaxis: SCD GI prophylaxis: N/A Lines: N/A Foley:  N/A Code Status:  full code Last date of multidisciplinary goals of care discussion [N/A]  Labs   CBC: Recent Labs  Lab 03/29/21 0415 03/29/21 0825  WBC 44.8* 44.8*  NEUTROABS  --  39.2*  HGB 12.3* 12.2*  HCT 35.3* 35.8*  MCV 84.7 85.2  PLT 461* 485*    Basic Metabolic Panel: Recent Labs  Lab 03/29/21 0415  NA 128*  K 3.8  CL 93*  CO2 26  GLUCOSE 103*  BUN 13  CREATININE 1.01  CALCIUM 8.5*   GFR: Estimated Creatinine Clearance: 104.4 mL/min (by C-G formula based on SCr of 1.01 mg/dL). Recent Labs  Lab 03/29/21 0415 03/29/21 0825 03/29/21 1003 03/29/21 1146  WBC 44.8* 44.8*  --   --   LATICACIDVEN  --   --  1.8 1.8    Liver Function Tests: No results for input(s): AST, ALT, ALKPHOS, BILITOT, PROT, ALBUMIN in the last 168 hours. No results for input(s): LIPASE, AMYLASE in the last 168 hours. No results for input(s): AMMONIA in the last 168 hours.  ABG    Component Value Date/Time   PHART 7.29 (L) 09/23/2016 0937   PCO2ART 57 (H) 09/23/2016 0937   PO2ART 182 (H) 09/23/2016 0937   HCO3 27.4 09/23/2016 0937   ACIDBASEDEF 0.6 09/23/2016 0937   O2SAT 99.5 09/23/2016 0937     Coagulation Profile: No results for input(s): INR, PROTIME in the last 168 hours.  Cardiac Enzymes: No results for input(s): CKTOTAL, CKMB,  CKMBINDEX, TROPONINI in the last 168 hours.  HbA1C: No results found for: HGBA1C  CBG: Recent Labs  Lab 03/29/21 1336  GLUCAP 86    Review of Systems:   Positives in BOLD: Gen: Denies fever, chills, weight change, fatigue, night sweats HEENT: Denies blurred vision, double vision, hearing loss, tinnitus, sinus congestion, rhinorrhea, sore throat, neck stiffness, dysphagia PULM: Denies shortness of breath, cough, sputum production,  hemoptysis, wheezing CV: Denies chest pain, edema, orthopnea, paroxysmal nocturnal dyspnea, palpitations GI: Denies abdominal pain, nausea, vomiting, diarrhea, hematochezia, melena, constipation, change in bowel habits GU: Denies dysuria, hematuria, polyuria, oliguria, urethral discharge Endocrine: Denies hot or cold intolerance, polyuria, polyphagia or appetite change Derm: Denies rash, dry skin, scaling or peeling skin change Heme: Denies easy bruising, bleeding, bleeding gums Neuro: Denies headache, numbness, weakness, slurred speech, loss of memory or consciousness   Past Medical History:  He,  has a past medical history of Alcohol use, Asthma, and Tobacco abuse.   Surgical History:  History reviewed. No pertinent surgical history.   Social History:   reports that he has been smoking cigarettes. He has a 3.00 pack-year smoking history. He has never used smokeless tobacco. He reports current alcohol use. He reports current drug use. Drug: Marijuana.   Family History:  His family history includes Alcohol abuse in his father; Hypertension in his mother; Hypothyroidism in his mother.   Allergies No Known Allergies   Home Medications  Prior to Admission medications   Not on File     Care time: 50 minutes     Darel Hong, AGACNP-BC [place] epic messenger for cross cover needs If after hours, please call E-link

## 2021-03-29 NOTE — ED Notes (Signed)
Lab called to say that pt's sugar was 20, finger stick done and is 86, lab called back to say that the glucose was in pt's pleural fluid not in a blood sample. MD notified

## 2021-03-29 NOTE — Progress Notes (Signed)
Called down to assess patient for tachycardia, increased chest wall pain.  Chest tube in place, straw fluid coming out, no air leak, question developing trapped lung on CXR.  For now, continue chest tube drainage, add multimodal pain meds with standing ketorolac + tylenol and oxycodone/dilaudid on graded pain scale PRN.  Regarding HR, related to pain and ongoing severe sepsis, add standing fluids and see how responds to better pain control.  Overall suspect he will need transfer to [facility] and VATS eval.  RN instructed to securechat me if any issues.  Myrla Halsted MD PCCM

## 2021-03-29 NOTE — Procedures (Signed)
Chest ultrasound:  Chest ultrasound was performed showed large left-sided complex pleural effusion with multiple loculations

## 2021-03-29 NOTE — ED Notes (Signed)
Pt back from IR, pt reports a slight decrease in pain, states that he is breathing easier.  Pt resps are even and unlabored, talking in full sentences.

## 2021-03-29 NOTE — ED Notes (Signed)
Pt to IR for thoracentesis.

## 2021-03-29 NOTE — ED Provider Notes (Signed)
[facility]  ____________________________________________   Event Date/Time   First MD Initiated Contact with Patient 03/29/21 [phone]     (approximate)  I have reviewed the triage vital signs and the nursing notes.   HISTORY  Chief Complaint Chest Pain    HPI Connor Williams is a 20 y.o. male with no significant pmh who present with chest pain.  Patient says that this pain started about 2 weeks ago.  Pain is located in the left chest and back.  Is worse with breathing.  He does endorse some shortness of breath as well.  He denies fevers chills night sweats or weight loss.  He has never had this pain before.  No history of lung disease but he says he thinks he has asthma.  Patient has been incarcerated for the past 3 weeks, no history of TB.  He denies any history of injection drug use.  Denies abdominal pain nausea vomiting or diarrhea.          History reviewed. No pertinent past medical history.  Patient Active Problem List   Diagnosis Date Noted  . Altered mental status 09/23/2016    History reviewed. No pertinent surgical history.  Prior to Admission medications   Medication Sig Start Date End Date Taking? Authorizing Provider  albuterol (VENTOLIN HFA) 108 (90 Base) MCG/ACT inhaler Inhale 2 puffs into the lungs every 6 (six) hours as needed for wheezing or shortness of breath. 01/20/20   Joni Reining, PA-C    Allergies Patient has no known allergies.  Family History  Problem Relation Age of Onset  . Hypothyroidism Mother   . Hypertension Mother   . Alcohol abuse Father     Social History Social History   Tobacco Use  . Smoking status: Every Day    Packs/day: 1.00    Years: 3.00    Pack years: 3.00    Types: Cigarettes  . Smokeless tobacco: Never  Substance Use Topics  . Alcohol use: Yes  . Drug use: Yes    Types: Marijuana    Review of Systems   Review of Systems  Constitutional:  Negative for appetite change, chills and  fever.  Respiratory:  Positive for shortness of breath. Negative for cough.   Cardiovascular:  Positive for chest pain. Negative for leg swelling.  Gastrointestinal:  Negative for abdominal pain, diarrhea, nausea and vomiting.  All other systems reviewed and are negative.  Physical Exam Updated Vital Signs BP 100/68 (BP Location: Left Arm)   Pulse (!) 118   Temp 97.6 F (36.4 C) (Oral)   Resp 18   SpO2 99%   Physical Exam Vitals and nursing note reviewed.  Constitutional:      General: He is not in acute distress.    Appearance: Normal appearance.  HENT:     Head: Normocephalic and atraumatic.  Eyes:     General: No scleral icterus.    Conjunctiva/sclera: Conjunctivae normal.  Cardiovascular:     Heart sounds: Normal heart sounds.  Pulmonary:     Effort: Pulmonary effort is normal. No respiratory distress.     Breath sounds: Examination of the left-middle field reveals decreased breath sounds. Examination of the left-lower field reveals decreased breath sounds. Decreased breath sounds present. No wheezing.  Musculoskeletal:        General: No deformity or signs of injury.     Cervical back: Normal range of motion.  Skin:    Coloration: Skin is not jaundiced or pale.  Neurological:  General: No focal deficit present.     Mental Status: He is alert and oriented to person, place, and time. Mental status is at baseline.  Psychiatric:        Mood and Affect: Mood normal.        Behavior: Behavior normal.     LABS (all labs ordered are listed, but only abnormal results are displayed)  Labs Reviewed  BASIC METABOLIC PANEL - Abnormal; Notable for the following components:      Result Value   Sodium 128 (*)    Chloride 93 (*)    Glucose, Bld 103 (*)    Calcium 8.5 (*)    All other components within normal limits  CBC - Abnormal; Notable for the following components:   WBC 44.8 (*)    RBC 4.17 (*)    Hemoglobin 12.3 (*)    HCT 35.3 (*)    Platelets 461 (*)    All  other components within normal limits  CBC WITH DIFFERENTIAL/PLATELET - Abnormal; Notable for the following components:   WBC 44.8 (*)    RBC 4.20 (*)    Hemoglobin 12.2 (*)    HCT 35.8 (*)    Platelets 485 (*)    All other components within normal limits  RESP PANEL BY RT-PCR (FLU A&B, COVID) ARPGX2  CULTURE, BLOOD (ROUTINE X 2)  CULTURE, BLOOD (ROUTINE X 2)  LACTIC ACID, PLASMA  LACTIC ACID, PLASMA  HIV ANTIBODY (ROUTINE TESTING W REFLEX)  TROPONIN I (HIGH SENSITIVITY)  TROPONIN I (HIGH SENSITIVITY)   ____________________________________________  EKG  Sinus tachycardia, incomplete right bundle branch block, J-point elevation diffusely similar to prior, increased voltage, no acute ischemic changes ____________________________________________  RADIOLOGY Ky Barban, personally viewed and evaluated these images (plain radiographs) as part of my medical decision making, as well as reviewing the written report by the radiologist.  ED MD interpretation: I reviewed the x-ray of the chest which shows a large left-sided pleural effusion    ____________________________________________   PROCEDURES  Procedure(s) performed (including Critical Care):  .1-3 Lead EKG Interpretation Performed by: Georga Hacking, MD Authorized by: Georga Hacking, MD     Interpretation: normal     ECG rate assessment: normal     Ectopy: none     Conduction: normal     ____________________________________________   INITIAL IMPRESSION / ASSESSMENT AND PLAN / ED COURSE   Patient is a 20 year old previously healthy male who is currently incarcerated who presents with chest pain and shortness of breath.  He has a significant leukocytosis with a white count of 44, differential is pending.  His chest x-ray is notable for a very large pleural effusion on the left side which is new.  Discussing with the patient he really has had no systemic symptoms including fevers chills night sweats or  weight loss.  No history of injection drug use or TB although he is incarcerated.  Plan to obtain a CT with contrast to better evaluate the effusion and any underlying infectious process.  Will give fluids obtain blood cultures and lactate HIV testing and will administer antibiotics.  He will require admission. Clinical Course as of 03/29/21 1045  Tue Mar 29, 2021  1045 Lactic Acid, Venous: 1.8 [KM]    Clinical Course User Index [KM] Georga Hacking, MD     ____________________________________________   FINAL CLINICAL IMPRESSION(S) / ED DIAGNOSES  Final diagnoses:  Pleural effusion  Sepsis, due to unspecified organism, unspecified whether acute organ dysfunction present (HCC)  Hyponatremia     ED Discharge Orders     None        Note:  This document was prepared using Dragon voice recognition software and may include unintentional dictation errors.    Georga Hacking, MD 03/29/21 [phone]

## 2021-03-29 NOTE — Procedures (Signed)
Insertion of Chest Tube Procedure Note  EDWARDS MCKELVIE  [id number]  2000/10/06  Date:03/29/21  Time:4:51 PM    Provider Performing: Cheri Fowler   Procedure: Pleural Catheter Insertion w/ Imaging Guidance (56213)  Indication(s) Effusion  Consent Risks of the procedure as well as the alternatives and risks of each were explained to the patient and/or caregiver.  Consent for the procedure was obtained and is signed in the bedside chart  Anesthesia Topical only with 1% lidocaine    Time Out Verified patient identification, verified procedure, site/side was marked, verified correct patient position, special equipment/implants available, medications/allergies/relevant history reviewed, required imaging and test results available.   Sterile Technique Maximal sterile technique including full sterile barrier drape, hand hygiene, sterile gown, sterile gloves, mask, hair covering, sterile ultrasound probe cover (if used).   Procedure Description Ultrasound used to identify appropriate pleural anatomy for placement and overlying skin marked. Area of placement cleaned and draped in sterile fashion.  A 14 French pigtail pleural catheter was placed into the left pleural space using Seldinger technique. Appropriate return of fluid was obtained.  The tube was connected to atrium and placed on -20 cm H2O wall suction.   Complications/Tolerance None; patient tolerated the procedure well. Chest X-ray is ordered to verify placement.   EBL Minimal  Specimen(s) none

## 2021-03-29 NOTE — Consult Note (Signed)
NAME: Connor Williams  DOB: 10-20-2000  MRN: [id number]  Date/Time: 03/29/2021 2:25 PM  REQUESTING PROVIDER: Dr. Blaine Hamper Subjective:  REASON FOR CONSULT: empyema ? Connor Williams is a 20 y.o. male with h/o asthma, tobacco use and alcohol use  presents from the [facility] with 2 week history of left sided chest pain. Pt has been in jail for the past 3 weeks - He developed left sided chest pain 2 weeks ago which got worse and had some sob . He also has drenching nocturnal sweats. The pain was worse with breathing. Pt has no fever or cough. He says he drinks > 6 pac of beer a day and has at times passed out/ vomited under the influence of alcohol. No tooth extraction. No IVDA, no immunecompromising conditions In the ED temp 97.6  100/68, . HR 118 and sats 100%. CXR showed left pleural effusion WBC44.8, HB 12.3, PLT 461 and cr 1.01.  After blood culture and was sent he was started on Vanco, cefepime and Flagyl.  He also had a CT chest which revealed a large partially loculated left pleural effusion. He underwent a left-sided thoracentesis and 150 mL of yellow-colored fluid was aspirated by IR.  It was sent for analysis and it showed LDH of 909, WBC of 3117 with 90% neutrophils, total protein of 5.4 and glucose of less than 20 suggestive of an empyema.  I am asked to see him for antibiotic management. He was seen by pulmonary team and pleural catheter was placed. Patient is doing okay.  He is sitting in bed and having his dinner.  Past Medical History:  Diagnosis Date   Alcohol use    Asthma    Tobacco abuse     History reviewed. No pertinent surgical history.  Social History   Socioeconomic History   Marital status: Single    Spouse name: Not on file   Number of children: Not on file   Years of education: Not on file   Highest education level: Not on file  Occupational History   Not on file  Tobacco Use   Smoking status: Every Day    Packs/day: 1.00    Years: 3.00    Pack years: 3.00    Types:  Cigarettes   Smokeless tobacco: Never  Substance and Sexual Activity   Alcohol use: Yes   Drug use: Yes    Types: Marijuana   Sexual activity: Not on file  Other Topics Concern   Not on file  Social History Narrative   Not on file   Social Determinants of Health   Financial Resource Strain: Not on file  Food Insecurity: Not on file  Transportation Needs: Not on file  Physical Activity: Not on file  Stress: Not on file  Social Connections: Not on file  Intimate Partner Violence: Not on file    Family History  Problem Relation Age of Onset   Hypothyroidism Mother    Hypertension Mother    Alcohol abuse Father   DM- grand father No Known Allergies I? Current Facility-Administered Medications  Medication Dose Route Frequency Provider Last Rate Last Admin   0.9 %  sodium chloride infusion   Intravenous Continuous Ivor Costa, MD       acetaminophen (TYLENOL) tablet 650 mg  650 mg Oral Q6H PRN Ivor Costa, MD   650 mg at 03/29/21 1253   albuterol (PROVENTIL) (2.5 MG/3ML) 0.083% nebulizer solution 2.5 mg  2.5 mg Nebulization Q4H PRN Ivor Costa, MD  ceFEPIme (MAXIPIME) 2 g in sodium chloride 0.9 % 100 mL IVPB  2 g Intravenous Q8H Virl Cagey E, RPH 200 mL/hr at 03/29/21 1414 2 g at 03/29/21 1414   dextromethorphan-guaiFENesin (MUCINEX DM) 30-600 MG per 12 hr tablet 1 tablet  1 tablet Oral BID PRN Ivor Costa, MD       folic acid (FOLVITE) tablet 1 mg  1 mg Oral Daily Ivor Costa, MD   1 mg at 03/29/21 1250   lactated ringers bolus 500 mL  500 mL Intravenous Once Ivor Costa, MD       metroNIDAZOLE (FLAGYL) IVPB 500 mg  500 mg Intravenous Huntley Dec, MD   Stopped at 03/29/21 1415   nicotine (NICODERM CQ - dosed in mg/24 hours) patch 21 mg  21 mg Transdermal Daily Ivor Costa, MD   21 mg at 03/29/21 1251   ondansetron (ZOFRAN) injection 4 mg  4 mg Intravenous Q8H PRN Ivor Costa, MD       oxyCODONE-acetaminophen (PERCOCET/ROXICET) 5-325 MG per tablet 1 tablet  1 tablet Oral Q4H  PRN Ivor Costa, MD   1 tablet at 03/29/21 1252   thiamine tablet 100 mg  100 mg Oral Daily Ivor Costa, MD   100 mg at 03/29/21 1252   [START ON 03/30/2021] vancomycin (VANCOREADY) IVPB 1000 mg/200 mL  1,000 mg Intravenous Q12H Dorothe Pea, RPH       No current outpatient medications on file.     Abtx:  Anti-infectives (From admission, onward)    Start     Dose/Rate Route Frequency Ordered Stop   03/30/21 0000  vancomycin (VANCOREADY) IVPB 1000 mg/200 mL        1,000 mg 200 mL/hr over 60 Minutes Intravenous Every 12 hours 03/29/21 1123     03/29/21 1400  ceFEPIme (MAXIPIME) 2 g in sodium chloride 0.9 % 100 mL IVPB        2 g 200 mL/hr over 30 Minutes Intravenous Every 8 hours 03/29/21 1119     03/29/21 1200  metroNIDAZOLE (FLAGYL) IVPB 500 mg        500 mg 100 mL/hr over 60 Minutes Intravenous Every 12 hours 03/29/21 1124     03/29/21 1100  vancomycin (VANCOREADY) IVPB 1500 mg/300 mL        1,500 mg 150 mL/hr over 120 Minutes Intravenous  Once 03/29/21 0924 03/29/21 1343   03/29/21 0930  cefTRIAXone (ROCEPHIN) 1 g in sodium chloride 0.9 % 100 mL IVPB        1 g 200 mL/hr over 30 Minutes Intravenous  Once 03/29/21 0924 03/29/21 1135       REVIEW OF SYSTEMS:  Const: negative fever, negative chills, negative weight loss Eyes: negative diplopia or visual changes, negative eye pain ENT: negative coryza, negative sore throat Resp: negative cough, hemoptysis, has dyspnea Cards: left sided chest pain, no palpitations, lower extremity edema GU: negative for frequency, dysuria and hematuria GI: Negative for abdominal pain, diarrhea, bleeding, constipation Skin: negative for rash and pruritus Heme: negative for easy bruising and gum/nose bleeding MS: negative for myalgias, arthralgias, back pain and muscle weakness Neurolo:negative for headaches, dizziness, vertigo, memory problems  Psych: negative for feelings of anxiety, depression  Endocrine: negative for thyroid,  diabetes Allergy/Immunology- negative for any medication or food allergies ?  Objective:  VITALS:  BP (!) 101/59 (BP Location: Right Arm)   Pulse (!) 135   Temp 99 F (37.2 C) (Oral)   Resp 18   Wt 65.8 kg  SpO2 97%   BMI 23.41 kg/m  PHYSICAL EXAM:  General: Alert, cooperative, no distress, appears stated age.  Head: Normocephalic, without obvious abnormality, atraumatic. Eyes: Conjunctivae clear, anicteric sclerae. Pupils are equal ENT Nares normal. No drainage or sinus tenderness. Lips, mucosa, and tongue normal. No Thrush Neck: Supple, symmetrical, no adenopathy, thyroid: non tender no carotid bruit and no JVD. Back: No CVA tenderness. Lungs: Decreased air entry on the left base.  Pleural catheter present.   Heart: Tachycardia Abdomen: Soft, non-tender,not distended. Bowel sounds normal. No masses Extremities: atraumatic, no cyanosis. No edema. No clubbing Skin: No rashes or lesions. Or bruising Lymph: Cervical, supraclavicular normal. Neurologic: Grossly non-focal Pertinent Labs Lab Results CBC    Component Value Date/Time   WBC 44.8 (H) 03/29/2021 0825   RBC 4.20 (L) 03/29/2021 0825   HGB 12.2 (L) 03/29/2021 0825   HCT 35.8 (L) 03/29/2021 0825   PLT 485 (H) 03/29/2021 0825   MCV 85.2 03/29/2021 0825   MCH 29.0 03/29/2021 0825   MCHC 34.1 03/29/2021 0825   RDW 13.4 03/29/2021 0825   LYMPHSABS 1.1 03/29/2021 0825   MONOABS 2.8 (H) 03/29/2021 0825   EOSABS 0.6 (H) 03/29/2021 0825   BASOSABS 0.2 (H) 03/29/2021 0825    CMP Latest Ref Rng & Units 03/29/2021 11/18/2019 09/23/2016  Glucose 70 - 99 mg/dL 103(H) 84 98  BUN 6 - 20 mg/dL 13 11 11   Creatinine 0.61 - 1.24 mg/dL 1.01 0.98 0.99  Sodium 135 - 145 mmol/L 128(L) 142 139  Potassium 3.5 - 5.1 mmol/L 3.8 3.6 3.2(L)  Chloride 98 - 111 mmol/L 93(L) 105 106  CO2 22 - 32 mmol/L 26 20(L) 25  Calcium 8.9 - 10.3 mg/dL 8.5(L) 8.8(L) 8.5(L)  Total Protein 6.5 - 8.1 g/dL - 8.2(H) 7.7  Total Bilirubin 0.3 - 1.2 mg/dL -  0.7 0.4  Alkaline Phos 38 - 126 U/L - 70 99  AST 15 - 41 U/L - 29 24  ALT 0 - 44 U/L - 23 12(L)      Microbiology: Recent Results (from the past 240 hour(s))  Resp Panel by RT-PCR (Flu A&B, Covid) Nasopharyngeal Swab     Status: None   Collection Time: 03/29/21  4:47 AM   Specimen: Nasopharyngeal Swab; Nasopharyngeal(NP) swabs in vial transport medium  Result Value Ref Range Status   SARS Coronavirus 2 by RT PCR NEGATIVE NEGATIVE Final    Comment: (NOTE) SARS-CoV-2 target nucleic acids are NOT DETECTED.  The SARS-CoV-2 RNA is generally detectable in upper respiratory specimens during the acute phase of infection. The lowest concentration of SARS-CoV-2 viral copies this assay can detect is 138 copies/mL. A negative result does not preclude SARS-Cov-2 infection and should not be used as the sole basis for treatment or other patient management decisions. A negative result may occur with  improper specimen collection/handling, submission of specimen other than nasopharyngeal swab, presence of viral mutation(s) within the areas targeted by this assay, and inadequate number of viral copies(<138 copies/mL). A negative result must be combined with clinical observations, patient history, and epidemiological information. The expected result is Negative.  Fact Sheet for Patients:  EntrepreneurPulse.com.au  Fact Sheet for Healthcare Providers:  IncredibleEmployment.be  This test is no t yet approved or cleared by the [place] FDA and  has been authorized for detection and/or diagnosis of SARS-CoV-2 by FDA under an Emergency Use Authorization (EUA). This EUA will remain  in effect (meaning this test can be used) for the duration of the COVID-19 declaration under  Section 564(b)(1) of the Act, 21 U.S.C.section 360bbb-3(b)(1), unless the authorization is terminated  or revoked sooner.       Influenza A by PCR NEGATIVE NEGATIVE Final   Influenza  B by PCR NEGATIVE NEGATIVE Final    Comment: (NOTE) The Xpert Xpress SARS-CoV-2/FLU/RSV plus assay is intended as an aid in the diagnosis of influenza from Nasopharyngeal swab specimens and should not be used as a sole basis for treatment. Nasal washings and aspirates are unacceptable for Xpert Xpress SARS-CoV-2/FLU/RSV testing.  Fact Sheet for Patients: BloggerCourse.com  Fact Sheet for Healthcare Providers: SeriousBroker.it  This test is not yet approved or cleared by the [place] FDA and has been authorized for detection and/or diagnosis of SARS-CoV-2 by FDA under an Emergency Use Authorization (EUA). This EUA will remain in effect (meaning this test can be used) for the duration of the COVID-19 declaration under Section 564(b)(1) of the Act, 21 U.S.C. section 360bbb-3(b)(1), unless the authorization is terminated or revoked.  Performed at [facility], [address] [place], [place] [address]     IMAGING RESULTS:  I have personally reviewed the films ? Impression/Recommendation 20 year old male presenting with 2-week history of left-sided chest pain.  Has a history of excess alcohol consumption and has passed out at times and has also vomited under the influence of alcohol.  Left-sided empyema.  Likely secondary to aspiration of gastric contents under influence of alcohol with underlying pneumonia and now with empyema. Is currently on Vanco cefepime and Flagyl. He has got a pleural drain.  Awaiting cultures.  Will need to get MRSA nares.  If that is negative vancomycin can be stopped. Very soon we can de-escalate antibiotics as common organisms are Streptococcus and anaerobes .  HIV nonreactive Discussed the management with the patient and the care team. ? ___________________________________________________ Discussed with patient, requesting provider Note:  This document was prepared using Dragon voice  recognition software and may include unintentional dictation errors.

## 2021-03-29 NOTE — Procedures (Signed)
PROCEDURE SUMMARY:  Successful US guided left thoracentesis. Yielded 150 mL of yellow colored fluid. Pt tolerated procedure well. No immediate complications. Effusion is severely loculated and therefore unable to drain percutaneously fully.   Specimen was sent for labs. CXR ordered.  EBL < 5 mL  Cloretta Ned 03/29/2021 12:18 PM

## 2021-03-29 NOTE — ED Notes (Signed)
Meds given   md at bedside to place chest tube.

## 2021-03-29 NOTE — ED Notes (Addendum)
14 fr chest tube inserted by md in left chest. pt tolerated without diff.   Chest tube connected to pleur vac by md.   Dressing to chest tube applied by md.  Sinus tach on monitor.  Pt alert, talking.

## 2021-03-29 NOTE — Consult Note (Signed)
Pharmacy Antibiotic Note  Connor Williams is a 20 y.o. male admitted on 03/29/2021 with pneumonia with suspected empyema. PMH includes incarceration for the past 3 weeks  Pharmacy has been consulted for Vancomycin and cefepime dosing.  Plan: Add metronidazole 500 mg Q12H for empiric anaerobic coverage  Cefepime 2 gram Q8H Vancomycin 1500 mg LD x 1 followed by Initiate Vancomycin 1000 mg Q12H. Goal AUC 400-550 Estimated AUC 463/Cmin: 11.7 Scr 1.01, IBW, Vd 0.72   Follow up MRSA PCR, cultures and de-escalate as able  Weight: 65.8 kg (145 lb 1 oz)  Temp (24hrs), Avg:98.5 F (36.9 C), Min:97.6 F (36.4 C), Max:99.3 F (37.4 C)  Recent Labs  Lab 03/29/21 0415 03/29/21 0825 03/29/21 1003  WBC 44.8* 44.8*  --   CREATININE 1.01  --   --   LATICACIDVEN  --   --  1.8    Estimated Creatinine Clearance: 104.4 mL/min (by C-G formula based on SCr of 1.01 mg/dL).    No Known Allergies  Antimicrobials this admission: 12/6 ceftriaxone x 1  12/6 cefepime >>  12/6 Vancomycin >> 12/6 Metronidazole >>    Dose adjustments this admission:   Microbiology results: 12/6 BCx: sent 12/6 plural fluid: sent  12/6 Sputum: sent  12/6 MRSA PCR: sent  Thank you for allowing pharmacy to be a part of this patient's care.  Sharen Hones, PharmD, BCPS Clinical Pharmacist   03/29/2021 11:25 AM

## 2021-03-29 NOTE — H&P (Addendum)
History and Physical    [name] DOB: [birth year] DOA: 03/29/2021  Referring MD/NP/PA:   PCP: Pcp, No   Patient coming from:  The patient is coming from [place]: chest pain and SOB  HPI: Connor Williams is a 20 y.o. male with medical history significant of tobacco abuse, alcohol use, asthma, who presents with chest pain shortness of breath.  Patient has been incarcerated for the past 3 weeks.  He states that he developed chest pain 2 weeks ago, which is located in left side of chest, persistent, pleuritic, 8 out of 10 severity, aggravated by deep breath, sharp, nonradiating.  Associated with shortness breath and mild intermittent cough. He denies fevers, chills, night sweats or weight loss. No hx of  history of TB.  Denies IV drug use.  No nausea, vomiting, diarrhea or abdominal pain.  No symptoms of UTI. He says he thinks he has asthma.  Patient states that he drinks beer every day before going to jail, 6 beers each day.  He did not drink alcohol in the past 3 weeks.  ED Course: pt was found to have WBC 54.8, lactic acid 1.8, troponin level 9 --> 9, negative COVID-19 PCR, sodium 128, renal function okay, temperature 99.3, blood pressure 124/76, heart rate 126 - 134, RR 24- 37, oxygen saturation 98% on room air.  ABG with a pH of 7.29, CO2 57, O2 182.  chest x-ray showed large left pleural effusion.  CT of chest confirmed large left-sided partially loculated pleural effusion.  Patient is admitted to progressive bed as inpatient.  Dr. Delaine Lame of ID is consulted.  CT-chest: Large partially loculated left pleural effusion of unknown etiology.Compressive atelectasis versus pneumonia in the left lower lobe and lingula.   Review of Systems:   General: no fevers, chills, no body weight gain, has fatigue HEENT: no blurry vision, hearing changes or sore throat Respiratory: has dyspnea, coughing, no wheezing CV: has chest pain, no palpitations GI: no nausea,  vomiting, abdominal pain, diarrhea, constipation GU: no dysuria, burning on urination, increased urinary frequency, hematuria  Ext: no leg edema Neuro: no unilateral weakness, numbness, or tingling, no vision change or hearing loss Skin: no rash, no skin tear. MSK: No muscle spasm, no deformity, no limitation of range of movement in spin Heme: No easy bruising.  Travel history: No recent long distant travel.  Allergy: No Known Allergies  Past Medical History:  Diagnosis Date   Alcohol use    Asthma    Tobacco abuse     History reviewed. No pertinent surgical history.  Social History:  reports that he has been smoking cigarettes. He has a 3.00 pack-year smoking history. He has never used smokeless tobacco. He reports current alcohol use. He reports current drug use. Drug: Marijuana.  Family History:  Family History  Problem Relation Age of Onset   Hypothyroidism Mother    Hypertension Mother    Alcohol abuse Father      Prior to Admission medications   Medication Sig Start Date End Date Taking? Authorizing Provider  albuterol (VENTOLIN HFA) 108 (90 Base) MCG/ACT inhaler Inhale 2 puffs into the lungs every 6 (six) hours as needed for wheezing or shortness of breath. 01/20/20   Sable Feil, PA-C    Physical Exam: Vitals:   03/29/21 1635 03/29/21 1640 03/29/21 1654 03/29/21 1754  BP:   99/62 (!) 99/52  Pulse: (!) 132 (!) 133 (!) 128 (!) 128  Resp: (!) 21 (!) 37 [place])  24 (!) 26  Temp:   98.9 F (37.2 C) 99.3 F (37.4 C)  TempSrc:   Oral Oral  SpO2: 97% 99% 99% 100%  Weight:       General: Not in acute distress HEENT:       Eyes: PERRL, EOMI, no scleral icterus.       ENT: No discharge from the ears and nose, no pharynx injection, no tonsillar enlargement.        Neck: No JVD, no bruit, no mass felt. Heme: No neck lymph node enlargement. Cardiac: S1/S2, RRR, No murmurs, No gallops or rubs. Respiratory: Decreased air movement on left side GI: Soft, nondistended,  nontender, no rebound pain, no organomegaly, BS present. GU: No hematuria Ext: No pitting leg edema bilaterally. 1+DP/PT pulse bilaterally. Musculoskeletal: No joint deformities, No joint redness or warmth, no limitation of ROM in spin. Skin: No rashes.  Neuro: Alert, oriented X3, cranial nerves II-XII grossly intact, moves all extremities normally. Psych: Patient is not psychotic, no suicidal or hemocidal ideation.  Labs on Admission: I have personally reviewed following labs and imaging studies  CBC: Recent Labs  Lab 03/29/21 0415 03/29/21 0825  WBC 44.8* 44.8*  NEUTROABS  --  39.2*  HGB 12.3* 12.2*  HCT 35.3* 35.8*  MCV 84.7 85.2  PLT 461* 123456*   Basic Metabolic Panel: Recent Labs  Lab 03/29/21 0415 03/29/21 1747  NA 128* 134*  K 3.8 3.3*  CL 93* 102  CO2 26 25  GLUCOSE 103* 119*  BUN 13 10  CREATININE 1.01 0.95  CALCIUM 8.5* 7.8*   GFR: Estimated Creatinine Clearance: 111 mL/min (by C-G formula based on SCr of 0.95 mg/dL). Liver Function Tests: Recent Labs  Lab 03/29/21 1747  ALBUMIN 2.3*   No results for input(s): LIPASE, AMYLASE in the last 168 hours. No results for input(s): AMMONIA in the last 168 hours. Coagulation Profile: Recent Labs  Lab 03/29/21 1747  INR 1.8*   Cardiac Enzymes: No results for input(s): CKTOTAL, CKMB, CKMBINDEX, TROPONINI in the last 168 hours. BNP (last 3 results) No results for input(s): PROBNP in the last 8760 hours. HbA1C: No results for input(s): HGBA1C in the last 72 hours. CBG: Recent Labs  Lab 03/29/21 1336  GLUCAP 86   Lipid Profile: No results for input(s): CHOL, HDL, LDLCALC, TRIG, CHOLHDL, LDLDIRECT in the last 72 hours. Thyroid Function Tests: No results for input(s): TSH, T4TOTAL, FREET4, T3FREE, THYROIDAB in the last 72 hours. Anemia Panel: No results for input(s): VITAMINB12, FOLATE, FERRITIN, TIBC, IRON, RETICCTPCT in the last 72 hours. Urine analysis:    Component Value Date/Time   COLORURINE  AMBER (A) 02/26/2021 1702   APPEARANCEUR CLEAR (A) 02/26/2021 1702   LABSPEC 1.014 02/26/2021 1702   PHURINE 6.0 02/26/2021 1702   GLUCOSEU NEGATIVE 02/26/2021 1702   HGBUR LARGE (A) 02/26/2021 1702   BILIRUBINUR NEGATIVE 02/26/2021 1702   KETONESUR NEGATIVE 02/26/2021 1702   PROTEINUR 100 (A) 02/26/2021 1702   NITRITE NEGATIVE 02/26/2021 1702   LEUKOCYTESUR NEGATIVE 02/26/2021 1702   Sepsis Labs: @LABRCNTIP (procalcitonin:4,lacticidven:4) ) Recent Results (from the past 240 hour(s))  Resp Panel by RT-PCR (Flu A&B, Covid) Nasopharyngeal Swab     Status: None   Collection Time: 03/29/21  4:47 AM   Specimen: Nasopharyngeal Swab; Nasopharyngeal(NP) swabs in vial transport medium  Result Value Ref Range Status   SARS Coronavirus 2 by RT PCR NEGATIVE NEGATIVE Final    Comment: (NOTE) SARS-CoV-2 target nucleic acids are NOT DETECTED.  The SARS-CoV-2 RNA is generally  detectable in upper respiratory specimens during the acute phase of infection. The lowest concentration of SARS-CoV-2 viral copies this assay can detect is 138 copies/mL. A negative result does not preclude SARS-Cov-2 infection and should not be used as the sole basis for treatment or other patient management decisions. A negative result may occur with  improper specimen collection/handling, submission of specimen other than nasopharyngeal swab, presence of viral mutation(s) within the areas targeted by this assay, and inadequate number of viral copies(<138 copies/mL). A negative result must be combined with clinical observations, patient history, and epidemiological information. The expected result is Negative.  Fact Sheet for Patients:  BloggerCourse.com  Fact Sheet for Healthcare Providers:  SeriousBroker.it  This test is no t yet approved or cleared by the [place] FDA and  has been authorized for detection and/or diagnosis of SARS-CoV-2 by FDA under an  Emergency Use Authorization (EUA). This EUA will remain  in effect (meaning this test can be used) for the duration of the COVID-19 declaration under Section 564(b)(1) of the Act, 21 U.S.C.section 360bbb-3(b)(1), unless the authorization is terminated  or revoked sooner.       Influenza A by PCR NEGATIVE NEGATIVE Final   Influenza B by PCR NEGATIVE NEGATIVE Final    Comment: (NOTE) The Xpert Xpress SARS-CoV-2/FLU/RSV plus assay is intended as an aid in the diagnosis of influenza from Nasopharyngeal swab specimens and should not be used as a sole basis for treatment. Nasal washings and aspirates are unacceptable for Xpert Xpress SARS-CoV-2/FLU/RSV testing.  Fact Sheet for Patients: BloggerCourse.com  Fact Sheet for Healthcare Providers: SeriousBroker.it  This test is not yet approved or cleared by the [place] FDA and has been authorized for detection and/or diagnosis of SARS-CoV-2 by FDA under an Emergency Use Authorization (EUA). This EUA will remain in effect (meaning this test can be used) for the duration of the COVID-19 declaration under Section 564(b)(1) of the Act, 21 U.S.C. section 360bbb-3(b)(1), unless the authorization is terminated or revoked.  Performed at [facility], [address] [place], [place] [address]      Radiological Exams on Admission: DG Chest 2 View  Result Date: 03/29/2021 CLINICAL DATA:  Chest pain EXAM: CHEST - 2 VIEW COMPARISON:  03/08/2020 FINDINGS: Large left pleural effusion with opacification of the mid to lower left chest. No abnormality of the adjacent ribs or history of trauma. There is cough and there could be underlying pneumonia. The pleural fluid appears scalloped and could be complicated. Normal heart size and mediastinal contours. IMPRESSION: Large left pleural effusion with opacified or obscured underlying lung. Suggest CT with contrast. Electronically Signed   By:  Tiburcio Pea M.D.   On: 03/29/2021 04:47   CT Chest W Contrast  Result Date: 03/29/2021 CLINICAL DATA:  Abnormal x-ray pleural effusion EXAM: CT CHEST WITH CONTRAST TECHNIQUE: Multidetector CT imaging of the chest was performed during intravenous contrast administration. CONTRAST:  29mL OMNIPAQUE IOHEXOL 300 MG/ML  SOLN COMPARISON:  Chest x-ray today FINDINGS: Cardiovascular: Heart is normal size. Aorta is normal caliber. Mediastinum/Nodes: No mediastinal, hilar, or axillary adenopathy. Trachea and esophagus are unremarkable. Thyroid unremarkable. Lungs/Pleura: Large partially loculated left pleural effusion. Airspace disease in the left lower lobe and lingula could reflect compressive atelectasis or less likely pneumonia. Right lung clear. Upper Abdomen: Imaging into the upper abdomen demonstrates no acute findings. Musculoskeletal: Chest wall soft tissues are unremarkable. No acute bony abnormality. IMPRESSION: Large partially loculated left pleural effusion of unknown etiology. Compressive atelectasis versus pneumonia in the  left lower lobe and lingula. Electronically Signed   By: Rolm Baptise M.D.   On: 03/29/2021 09:16   DG Chest Portable 1 View  Result Date: 03/29/2021 CLINICAL DATA:  Chest tube placement. EXAM: PORTABLE CHEST 1 VIEW COMPARISON:  Chest x-ray from same day at 1223 hours. FINDINGS: New left-sided chest tube. Unchanged loculated large left pleural effusion. No pneumothorax. The right lung is clear. The heart size and mediastinal contours are within normal limits. No acute osseous abnormality. IMPRESSION: 1. New left-sided chest tube with unchanged loculated large left pleural effusion. No pneumothorax. Electronically Signed   By: Titus Dubin M.D.   On: 03/29/2021 16:48   DG Chest Port 1 View  Result Date: 03/29/2021 CLINICAL DATA:  Left pleural effusion.  Status post thoracentesis. EXAM: PORTABLE CHEST 1 VIEW COMPARISON:  Prior today FINDINGS: A large left pleural effusion  shows mild decrease in size since previous study. No evidence of pneumothorax. Left lower lung atelectasis or consolidation again noted. Right lung is clear. Heart size is normal. IMPRESSION: Mild decrease in size of large left pleural effusion. No pneumothorax visualized. Persistent left lower lung atelectasis versus consolidation. Electronically Signed   By: Marlaine Hind M.D.   On: 03/29/2021 12:35   US THORACENTESIS ASP PLEURAL SPACE W/IMG GUIDE  Result Date: 03/29/2021 INDICATION: Chest pain with a left pleural effusion and concern for empyema request received for diagnostic thoracentesis. EXAM: ULTRASOUND GUIDED LEFT THORACENTESIS MEDICATIONS: Local 1% lidocaine only. COMPLICATIONS: None immediate. PROCEDURE: An ultrasound guided thoracentesis was thoroughly discussed with the patient and questions answered. The benefits, risks, alternatives and complications were also discussed. The patient understands and wishes to proceed with the procedure. Written consent was obtained. Ultrasound was performed to localize and mark an adequate pocket of fluid in the left chest. The area was then prepped and draped in the normal sterile fashion. 1% Lidocaine was used for local anesthesia. Under ultrasound guidance a 19 gauge, 7-cm, Yueh catheter was introduced. Thoracentesis was performed. The catheter was removed and a dressing applied. FINDINGS: A total of approximately 150 mL of yellow colored fluid was removed. Samples were sent to the laboratory as requested by the clinical team. Effusion is severely loculated and therefore unable to fully decompress percutaneously. IMPRESSION: Successful ultrasound guided left thoracentesis yielding 150 mL of pleural fluid. Effusion is severely loculated and therefore unable to fully decompress percutaneously. Read By: Tsosie Billing PA-C Electronically Signed   By: Jacqulynn Cadet M.D.   On: 03/29/2021 13:33     EKG: I have personally reviewed.  Sinus tachycardia, LAE, J-point  elevation in inferior leads  Assessment/Plan Principal Problem:   CAP (community acquired pneumonia) Active Problems:   Pleural effusion on left   Hyponatremia   Sepsis ([place])   Tobacco abuse   Asthma   Alcohol use   Empyema ([facility])   Sepsis due to CAP (community acquired pneumonia) with pleural effusion on left: Patient has  large left-sided partially loculated pleural effusion, suspecting empyema.  Patient meets criteria for sepsis with WBC 44.8, tachycardia with heart rate up to 130s, RR up to 37.  Lactic acid is normal 1.8.  Currently hemodynamically stable.  - Will admit to progressive bed as inpt - IV Vancomycin and cefepime, flagyl (patient received 1 dose of Rocephin in ED) - Mucinex for cough  - Bronchodilators - Urine legionella and S. pneumococcal antigen - Follow up blood culture x2, sputum culture - will get Procalcitonin - IVF: 2.5 L of LR bolus in ED, followed by  125 mL/h of NS - will get thoracentesis for INR - Dr. Delaine Lame of ID is consulted.  Addendum: Thoracentesis was done, initial analysis confirmed empyema.  Consulted Dr. Tacy Learn of ICU and Dr. Kipp Brood of thoracic surgery. Initially Dr. Kipp Brood said the patient can be transferred to [facility], they will evaluate patient for surgery tomorrow.  Since patient is septic, I also consulted with Dr. Tacy Learn of PCCM. He kindly agreed to place a chest tube. Per Dr. Kipp Brood, patient can be kept here with chest tube placement.  Hopefully surgery can be avoided and patient's lung will reexpand.  Per Dr. Kipp Brood, may try treating with lyctics via tube if needed.  If pleural effusion does not resolve in few days, can call back CT surgery then.  Hyponatremia: Sodium 128, mental status normal. - Will check urine sodium, urine osmolality, serum osmolality. - Fluid restriction - IVF: as above - f/u by BMP q8h  Asthma: No wheezing on auscultation. -As needed albuterol  Tobacco abuse and Alcohol use: Patient did not  drink alcohol in the past 3 weeks, no alcohol withdrawal symptoms. -Did counseling about importance of quitting smoking and drinking -Nicotine patch -start folic acid and Vitamin B1   DVT ppx: SCD Code Status: Full code Family Communication: Engineer, structural at bed side.    Disposition Plan:  Anticipate discharge back to previous environment, [employer] called:   Dr. Delaine Lame of ID is consulted. Admission status and Level of care: Stepdown:   as inpt      Status is: Inpatient  Remains inpatient appropriate because: Patient has history of tobacco abuse, asthma, who presents with sepsis due to CAP, with large left-sided pleural effusion, suspecting empyema.  His presentation is highly complicated.  Patient is at high risk of deteriorating.  Will need to be treated in the hospital for at least 2 days.       Date of Service 03/29/2021    Ivor Costa [facility]   If 7PM-7AM, please contact night-coverage [email or web address] 03/29/2021, 7:13 PM

## 2021-03-30 ENCOUNTER — Inpatient Hospital Stay: Payer: Medicaid Other

## 2021-03-30 DIAGNOSIS — J189 Pneumonia, unspecified organism: Secondary | ICD-10-CM | POA: Diagnosis not present

## 2021-03-30 DIAGNOSIS — Z789 Other specified health status: Secondary | ICD-10-CM

## 2021-03-30 DIAGNOSIS — J869 Pyothorax without fistula: Secondary | ICD-10-CM | POA: Diagnosis not present

## 2021-03-30 DIAGNOSIS — E871 Hypo-osmolality and hyponatremia: Secondary | ICD-10-CM | POA: Diagnosis not present

## 2021-03-30 DIAGNOSIS — A419 Sepsis, unspecified organism: Secondary | ICD-10-CM | POA: Diagnosis not present

## 2021-03-30 DIAGNOSIS — R6521 Severe sepsis with septic shock: Secondary | ICD-10-CM

## 2021-03-30 LAB — CBC
HCT: 29.2 % — ABNORMAL LOW (ref 39.0–52.0)
HCT: 29.6 % — ABNORMAL LOW (ref 39.0–52.0)
Hemoglobin: 10.1 g/dL — ABNORMAL LOW (ref 13.0–17.0)
Hemoglobin: 10.3 g/dL — ABNORMAL LOW (ref 13.0–17.0)
MCH: 28.9 pg (ref 26.0–34.0)
MCH: 29.3 pg (ref 26.0–34.0)
MCHC: 34.6 g/dL (ref 30.0–36.0)
MCHC: 34.8 g/dL (ref 30.0–36.0)
MCV: 83.7 fL (ref 80.0–100.0)
MCV: 84.3 fL (ref 80.0–100.0)
Platelets: 417 10*3/uL — ABNORMAL HIGH (ref 150–400)
Platelets: 421 10*3/uL — ABNORMAL HIGH (ref 150–400)
RBC: 3.49 MIL/uL — ABNORMAL LOW (ref 4.22–5.81)
RBC: 3.51 MIL/uL — ABNORMAL LOW (ref 4.22–5.81)
RDW: 13.5 % (ref 11.5–15.5)
RDW: 13.6 % (ref 11.5–15.5)
WBC: 30.5 10*3/uL — ABNORMAL HIGH (ref 4.0–10.5)
WBC: 31.8 10*3/uL — ABNORMAL HIGH (ref 4.0–10.5)
nRBC: 0 % (ref 0.0–0.2)
nRBC: 0 % (ref 0.0–0.2)

## 2021-03-30 LAB — LACTIC ACID, PLASMA: Lactic Acid, Venous: 1.1 mmol/L (ref 0.5–1.9)

## 2021-03-30 LAB — PROCALCITONIN: Procalcitonin: 18.3 ng/mL

## 2021-03-30 LAB — CYTOLOGY - NON PAP

## 2021-03-30 LAB — LEGIONELLA PNEUMOPHILA SEROGP 1 UR AG: L. pneumophila Serogp 1 Ur Ag: NEGATIVE

## 2021-03-30 LAB — STREP PNEUMONIAE URINARY ANTIGEN: Strep Pneumo Urinary Antigen: NEGATIVE

## 2021-03-30 MED ORDER — VANCOMYCIN HCL 1250 MG/250ML IV SOLN
1250.0000 mg | Freq: Two times a day (BID) | INTRAVENOUS | Status: DC
  Administered 2021-03-30 – 2021-04-01 (×4): 1250 mg via INTRAVENOUS
  Filled 2021-03-30 (×6): qty 250

## 2021-03-30 MED ORDER — SODIUM CHLORIDE (PF) 0.9 % IJ SOLN
10.0000 mg | Freq: Once | INTRAMUSCULAR | Status: AC
  Administered 2021-03-30: 10 mg via INTRAPLEURAL
  Filled 2021-03-30: qty 10

## 2021-03-30 MED ORDER — SODIUM CHLORIDE 0.9% FLUSH
10.0000 mL | Freq: Three times a day (TID) | INTRAVENOUS | Status: DC
  Administered 2021-03-30 – 2021-04-02 (×7): 10 mL

## 2021-03-30 MED ORDER — PANTOPRAZOLE SODIUM 40 MG PO TBEC
40.0000 mg | DELAYED_RELEASE_TABLET | Freq: Every day | ORAL | Status: DC
  Administered 2021-03-30 – 2021-04-02 (×4): 40 mg via ORAL
  Filled 2021-03-30 (×4): qty 1

## 2021-03-30 MED ORDER — STERILE WATER FOR INJECTION IJ SOLN
5.0000 mg | Freq: Once | RESPIRATORY_TRACT | Status: AC
  Administered 2021-03-30: 5 mg via INTRAPLEURAL
  Filled 2021-03-30: qty 5

## 2021-03-30 MED ORDER — LACTATED RINGERS IV BOLUS
1000.0000 mL | Freq: Once | INTRAVENOUS | Status: AC
  Administered 2021-03-30: 1000 mL via INTRAVENOUS

## 2021-03-30 NOTE — ED Notes (Signed)
Pleural cannister changed at this time. Total output on first cannister 1820 ml.

## 2021-03-30 NOTE — Progress Notes (Signed)
Protocol consult for US guided IV received due to vasopressor order. Securechat sent to RN regarding IV access needs. Per RN, [place] IV not needed at this time.

## 2021-03-30 NOTE — ED Notes (Signed)
Update given to jail RN.

## 2021-03-30 NOTE — ED Notes (Signed)
Patient AOX4. Resp even, unlabored on RA. Chest tube to left chest to -20 cm suction with 1790 ml serosang fluid in chamber. No air leak noted. No crepitus palpated. Officer at bedside. Patient pleasant and cooperative with care.

## 2021-03-30 NOTE — Progress Notes (Signed)
NAME:  Connor Williams, MRN:  [id number], DOB:  02-17-2001, LOS: 1 ADMISSION DATE:  03/29/2021, CONSULTATION DATE:  03/29/2021 REFERRING MD:  Dr. Clyde Lundborg, CHIEF COMPLAINT:  Chest pain and Shortness of Breath   Brief Pt Description / Synopsis:  20 y.o. Male admitted with Sepsis due to Community Acquired Pneumonia with large Left sided partially loculated pleural effusion concerning for Empyema.  PCCM consulted for chest tube placement. ID following.  May require transfer to [facility] for CT Surgery and Decortication.  History of Present Illness:  Connor Williams is a 20 year old male with a past medical history significant for asthma, tobacco abuse, and alcohol use who presented to [facility] ED on 03/29/2021 due to complaints of chest pain and shortness of breath.    Patient has been incarcerated for the past 3 weeks, and reports he developed chest pain approximately 2 weeks ago.  The chest pain is described as located in the left side, persistent, sharp, pleuritic, and aggravated by deep breaths.  He also reported associated shortness of breath and mild intermittent nonproductive cough.  He reports that approximately 2 days ago he woke up drenched in sweat.  He denies dizziness, palpitations, abdominal pain, nausea, vomiting, diarrhea, dysuria.  He denies IV drug use, does report drinking 6 beers every day prior to going to jail.  ED Course: Initial vital signs: Temperature 99.3 orally, respiratory rate 36, pulse 128, blood pressure 119/69, SPO2 98% on room air Significant Labs: Sodium 128, WBC 44.8 with neutrophilia, hemoglobin 12.3, hematocrit 35.3, platelets 461 serum acetaminophen and less than 10, salicylates less than 7, lactic acid 1.8 SARS-CoV-2 and influenza PCR negative Urine drug screen is negative Imaging: Chest x-ray>>Large left pleural effusion with opacified or obscured underlying lung. Suggest CT with contrast.. CT chest>>Large partially loculated left pleural effusion of unknown etiology.  Compressive atelectasis versus pneumonia in the left lower lobe and lingula. Medications given: 2.5 L of LR boluses, ceftriaxone x1 dose, cefepime, Flagyl, vancomycin  Thoracentesis was performed by IR which yielded 150 cc of yellow colored fluid (effusion noted to be severely loculated and therefore unable to drain fully).  Preliminary pleural fluid analysis is consistent for exudate.  PCCM was consulted for chest tube placement.  CT surgery at [facility] has been contacted, patient may need transfer to [facility] for decortication.  Pertinent  Medical History  Asthma Tobacco Abuse Alcohol use  Micro Data:  03/29/2021: SARS-CoV-2 and influenza PCR>> negative 03/29/2021: Blood culture x2>> 03/29/2021: Pleural fluid>> 03/29/2021: Acid-fast smear pleural fluid>> 03/29/2021: Acid-fast culture from pleural fluid>> 03/29/2021: Strep pneumo urinary antigen>> 03/29/2021: Legionella urinary antigen>>  Antimicrobials:  Ceftriaxone 12/6 x1 dose Cefepime 12/6 Flagyl 12/6>> Vancomycin 12/6>>  Significant Hospital Events: Including procedures, antibiotic start and stop dates in addition to other pertinent events   03/29/21: Presented to ED with SOB and chest pain.  Found to have Pneumonia with large left partially pleural effusion suspicious for Empyema. Thoracentesis performed by IR. PCCM consulted for chest tube placement.  ID consulted 03/30/21: Requiring low dose pressors (Levophed 3 mcg), will give additional 1L LR bolus and assess fluid response.  Instillation of tPA/Dornase into chest tube, plan to repeat this evening  Interim History / Subjective:  -Overnight complained of chest pain ~ felt to be attributed to questionable developing trapped lung ~ added standing Ketorolac + tylenol -This morning denies chest pain or SOB -CXR this morning with worsening/enlarging empyema ~ tPA/Dornase instilled into chest tube this morning -Per Dr. Merrily Pew will repeat tPA/Dornase tonight and  reassess CXR in the  AM -May ultimately require transfer to [facility] for CT Surgery and Decortication -Requiring low dose Levophed (3 mcg) ~ will give additional 1L LR bolus and assess for fluid responsiveness  Objective   Blood pressure 105/68, pulse (!) 107, temperature 99.3 F (37.4 C), temperature source Oral, resp. rate 18, weight 65.8 kg, SpO2 97 %.        Intake/Output Summary (Last 24 hours) at 03/30/2021 0935 Last data filed at 03/30/2021 [id number] Gross per 24 hour  Intake 5572.09 ml  Output 2200 ml  Net 3372.09 ml    Filed Weights   03/29/21 0900  Weight: 65.8 kg    Examination: General: Acutely ill-appearing male, laying in bed, on room air, no acute distress HENT: Atraumatic, normocephalic, neck supple, no JVD Lungs: Clear breath sounds to the right and left upper zone, diminished breath sounds to left lower zone, even, nonlabored Cardiovascular: Tachycardia, regular rhythm (sinus tachycardia on telemetry), no murmurs, rubs, gallops, 2+ distal pulses Abdomen: Soft, nontender, nondistended, no guarding rebound tenderness, bowel sounds positive x4 Extremities: No deformities, no edema, normal bulk and tone Neuro: Awake and alert, oriented x4, follows commands, no focal deficits, speech clear, pupils PERRLA GU: Deferred  Resolved Hospital Problem list     Assessment & Plan:   Septic Shock -Continuous cardiac monitoring -Maintain MAP >65 -IV fluids (will give additional 1L LR and assess fluid responsiveness) -Vasopressors as needed to maintain MAP goal -Lactic acid normalized (1.1 ~ 1.1) -HS Troponin negative x2 (9 ~ 9)  Severe Sepsis in the setting of Pneumonia with Large Left partially loculated Pleural Effusion concerning for EMPYEMA (Meets SIRS Criteria: WBC 44.8, HR 130's, RR 37) -Monitor fever curve -Trend WBC's & Procalcitonin -Follow cultures as above -ID following, appreciate input -Continue empiric Cefepime, Flagyl, Vancomycin pending cultures & sensitivities ~ will defer  to ID -Thoracentesis performed 12/5 by IR ~ yielded 150 cc of yellow colored fluid (effusion noted to be severely loculated and therefore unable to drain fully) ~preliminary analysis consistent with EMPYEMA -Chest tube placement by Dr. Tacy Learn 12/6 -Instillation of tPA/Dornase into chest tube on 12/7 ~ plan to repeat this evening -Follow up CXR in the AM ~ may ultimately require transfer to [facility] for CT Surgery and decortication      Best Practice (right click and "Reselect all SmartList Selections" daily)   Diet/type: Regular consistency (see orders) DVT prophylaxis: SCD GI prophylaxis: N/A Lines: N/A Foley:  N/A Code Status:  full code Last date of multidisciplinary goals of care discussion [N/A]  Labs   CBC: Recent Labs  Lab 03/29/21 0415 03/29/21 0825 03/30/21 0703  WBC 44.8* 44.8* 30.5*  31.8*  NEUTROABS  --  39.2*  --   HGB 12.3* 12.2* 10.3*  10.1*  HCT 35.3* 35.8* 29.6*  29.2*  MCV 84.7 85.2 84.3  83.7  PLT 461* 485* 417*  421*     Basic Metabolic Panel: Recent Labs  Lab 03/29/21 0415 03/29/21 1747  NA 128* 134*  K 3.8 3.3*  CL 93* 102  CO2 26 25  GLUCOSE 103* 119*  BUN 13 10  CREATININE 1.01 0.95  CALCIUM 8.5* 7.8*    GFR: Estimated Creatinine Clearance: 111 mL/min (by C-G formula based on SCr of 0.95 mg/dL). Recent Labs  Lab 03/29/21 0415 03/29/21 0825 03/29/21 1003 03/29/21 1146 03/29/21 1747 03/29/21 2127 03/30/21 0703  PROCALCITON  --   --   --   --  22.48  --   --  WBC 44.8* 44.8*  --   --   --   --  30.5*  31.8*  LATICACIDVEN  --   --  1.8 1.8  --  1.1 1.1     Liver Function Tests: Recent Labs  Lab 03/29/21 1747  ALBUMIN 2.3*   No results for input(s): LIPASE, AMYLASE in the last 168 hours. No results for input(s): AMMONIA in the last 168 hours.  ABG    Component Value Date/Time   PHART 7.29 (L) 09/23/2016 0937   PCO2ART 57 (H) 09/23/2016 0937   PO2ART 182 (H) 09/23/2016 0937   HCO3 27.4 09/23/2016 0937    ACIDBASEDEF 0.6 09/23/2016 0937   O2SAT 99.5 09/23/2016 0937      Coagulation Profile: Recent Labs  Lab 03/29/21 1747  INR 1.8*    Cardiac Enzymes: No results for input(s): CKTOTAL, CKMB, CKMBINDEX, TROPONINI in the last 168 hours.  HbA1C: No results found for: HGBA1C  CBG: Recent Labs  Lab 03/29/21 1336  GLUCAP 86     Review of Systems:   Positives in BOLD:  Gen: Denies fever, chills, weight change, fatigue, night sweats HEENT: Denies blurred vision, double vision, hearing loss, tinnitus, sinus congestion, rhinorrhea, sore throat, neck stiffness, dysphagia PULM: Denies shortness of breath, cough, sputum production, hemoptysis, wheezing CV: Denies chest pain, edema, orthopnea, paroxysmal nocturnal dyspnea, palpitations GI: Denies abdominal pain, nausea, vomiting, diarrhea, hematochezia, melena, constipation, change in bowel habits GU: Denies dysuria, hematuria, polyuria, oliguria, urethral discharge Endocrine: Denies hot or cold intolerance, polyuria, polyphagia or appetite change Derm: Denies rash, dry skin, scaling or peeling skin change Heme: Denies easy bruising, bleeding, bleeding gums Neuro: Denies headache, numbness, weakness, slurred speech, loss of memory or consciousness   Past Medical History:  He,  has a past medical history of Alcohol use, Asthma, and Tobacco abuse.   Surgical History:  History reviewed. No pertinent surgical history.   Social History:   reports that he has been smoking cigarettes. He has a 3.00 pack-year smoking history. He has never used smokeless tobacco. He reports current alcohol use. He reports current drug use. Drug: Marijuana.   Family History:  His family history includes Alcohol abuse in his father; Hypertension in his mother; Hypothyroidism in his mother.   Allergies No Known Allergies   Home Medications  Prior to Admission medications   Not on File     Care time: 40 minutes     Darel Hong, AGACNP-BC [employer] epic messenger for cross cover needs If after hours, please call E-link

## 2021-03-30 NOTE — ED Notes (Addendum)
Patient resting comfortably at this time in stretcher. Officer at bedside. No needs expressed at this time. NAD noted.

## 2021-03-30 NOTE — Procedures (Signed)
Instillation of tPA/dornase into the chest tube:   Chest tube was clamped, port was cleaned with alcohol swab. 10 mg of alteplase was given to chest tube, followed by 5 mg of dornase.  Chest tube will remain clamped for 1 hour, will reopen after 1 hour and assess.  Patient tolerated procedure well     Judithann Graves MD [facility] See Amion for pager If no response to pager, please call [phone] until 7pm After 7pm, Please call E-link [phone]

## 2021-03-30 NOTE — Procedures (Signed)
Instillation of tPA/dornase into the chest tube:   Chest tube was clamped, port was cleaned with alcohol swab. 10 mg of alteplase was given to chest tube, followed by 5 mg of dornase.  Chest tube will remain clamped for 1 hour, will reopen after 1 hour and assess.  Patient tolerated procedure well     Cheri Fowler MD [place] [facility] See Amion for pager If no response to pager, please call [phone] until 7pm After 7pm, Please call E-link [phone]

## 2021-03-30 NOTE — Progress Notes (Signed)
Patient ID: Connor Williams, male   DOB: September 23, 2000, 20 y.o.   MRN: [id number] [facility] PROGRESS NOTE  Connor Williams ERD:[id number] DOB: Feb 12, 2001 DOA: 03/29/2021 PCP: Pcp, No  HPI/Subjective: Patient states that he has been having chest pain and some shortness of breath going on for a while.  Some cough.  Objective: Vitals:   03/30/21 1000 03/30/21 1030  BP: 100/66 117/74  Pulse: 99 96  Resp: 18   Temp:    SpO2: 97% 96%    Intake/Output Summary (Last 24 hours) at 03/30/2021 1047 Last data filed at 03/30/2021 0904 Gross per 24 hour  Intake 5572.09 ml  Output 2200 ml  Net 3372.09 ml   Filed Weights   03/29/21 0900  Weight: 65.8 kg    ROS: Review of Systems  Respiratory:  Positive for cough and shortness of breath.   Cardiovascular:  Positive for chest pain.  Gastrointestinal:  Negative for abdominal pain.  Exam: Physical Exam HENT:     Head: Normocephalic.     Mouth/Throat:     Pharynx: No oropharyngeal exudate.  Eyes:     General: Lids are normal.     Conjunctiva/sclera: Conjunctivae normal.  Cardiovascular:     Rate and Rhythm: Normal rate and regular rhythm.     Heart sounds: Normal heart sounds, S1 normal and S2 normal.  Pulmonary:     Breath sounds: Examination of the left-middle field reveals decreased breath sounds. Examination of the left-lower field reveals decreased breath sounds and rhonchi. Decreased breath sounds and rhonchi present. No wheezing or rales.  Abdominal:     Palpations: Abdomen is soft.     Tenderness: There is no abdominal tenderness.  Musculoskeletal:     Right lower leg: No swelling.     Left lower leg: No swelling.  Skin:    General: Skin is warm.     Findings: No rash.  Neurological:     Mental Status: He is alert and oriented to person, place, and time.      Scheduled Meds:  acetaminophen  650 mg Oral Q6H WA   folic acid  1 mg Oral Daily   ketorolac  30 mg Intravenous Q6H   nicotine  21 mg Transdermal Daily    pantoprazole  40 mg Oral Daily   sodium chloride flush  10 mL Other Q8H   thiamine  100 mg Oral Daily   Continuous Infusions:  sodium chloride     ceFEPime (MAXIPIME) IV Stopped (03/30/21 5631)   lactated ringers 125 mL/hr at 03/30/21 0713   metronidazole Stopped (03/30/21 0117)   norepinephrine (LEVOPHED) Adult infusion 3 mcg/min (03/30/21 0904)   vancomycin      Assessment/Plan:  Septic shock, present on admission with left-sided empyema, tachycardia, leukocytosis, fever and hypotension..  Patient has chest tube in.  Case discussed with critical care team and 10 mg of alteplase was pushed into the chest tube followed by 5 mg of dornase.  They will do this again this evening.  May end up needing cardiothoracic surgery if does not start draining more.  Patient on triple antibiotics with Maxipime, vancomycin and Flagyl at this point.  Patient on low-dose Levophed at this point. History of alcohol use.  On thiamine.  No signs of withdrawal Hyponatremia.  Recheck labs tomorrow     Code Status:     Code Status Orders  (From admission, onward)           Start     Ordered   03/29/21  1117  Full code  Continuous        03/29/21 1117           Code Status History     This patient has a current code status but no historical code status.      Family Communication: As per police officer, unable to contact family secondary to safety issue Disposition Plan: Status is: Inpatient  Consultants: Critical care specialist Infectious disease  Antibiotics: Vancomycin, cefepime and Flagyl  Time spent: 27 minutes  Connor Williams [employer]

## 2021-03-30 NOTE — Consult Note (Signed)
Pharmacy Antibiotic Note  Connor Williams is a 20 y.o. male admitted on 03/29/2021 with pneumonia with suspected empyema. PMH includes incarceration for the past 3 weeks  Pharmacy has been consulted for Vancomycin and cefepime dosing.  Plan: Continue Cefepime 2 gram Q8H Change vancomycin 1000 mg q12h to 1250 mg q12h Estimated AUC 546 Scr 0.95, IBW, Vd 0.72   Follow up MRSA PCR, cultures and de-escalate as able Monitor renal function and adjust dose as clinically indicated  Weight: 65.8 kg (145 lb 1 oz)  Temp (24hrs), Avg:99.7 F (37.6 C), Min:98.9 F (37.2 C), Max:101.8 F (38.8 C)  Recent Labs  Lab 03/29/21 0415 03/29/21 0825 03/29/21 1003 03/29/21 1146 03/29/21 1747 03/29/21 2127 03/30/21 0703  WBC 44.8* 44.8*  --   --   --   --  30.5*  31.8*  CREATININE 1.01  --   --   --  0.95  --   --   LATICACIDVEN  --   --  1.8 1.8  --  1.1 1.1     Estimated Creatinine Clearance: 111 mL/min (by C-G formula based on SCr of 0.95 mg/dL).    No Known Allergies  Antimicrobials this admission: 12/6 ceftriaxone x 1  12/6 cefepime >>  12/6 Vancomycin >> 12/6 Metronidazole >>    Dose adjustments this admission: 12/7 vancomycin 1000 mg q12h > 1250 mg q12h  Microbiology results: 12/6 BCx: sent 12/6 plural fluid: sent  12/6 Sputum: sent  12/6 MRSA PCR: sent  Thank you for allowing pharmacy to be a part of this patient's care.  Raiford Noble, PharmD, BCPS Clinical Pharmacist   03/30/2021 8:17 AM

## 2021-03-30 NOTE — ED Notes (Signed)
Chest tube unclamped. Placed at -20 cm suction.

## 2021-03-31 ENCOUNTER — Inpatient Hospital Stay: Payer: Medicaid Other

## 2021-03-31 ENCOUNTER — Other Ambulatory Visit: Payer: Self-pay

## 2021-03-31 DIAGNOSIS — A419 Sepsis, unspecified organism: Secondary | ICD-10-CM | POA: Diagnosis not present

## 2021-03-31 DIAGNOSIS — J189 Pneumonia, unspecified organism: Secondary | ICD-10-CM | POA: Diagnosis not present

## 2021-03-31 DIAGNOSIS — Z789 Other specified health status: Secondary | ICD-10-CM | POA: Diagnosis not present

## 2021-03-31 DIAGNOSIS — R000 Tachycardia, unspecified: Secondary | ICD-10-CM | POA: Diagnosis not present

## 2021-03-31 DIAGNOSIS — J869 Pyothorax without fistula: Secondary | ICD-10-CM | POA: Diagnosis not present

## 2021-03-31 DIAGNOSIS — E876 Hypokalemia: Secondary | ICD-10-CM | POA: Insufficient documentation

## 2021-03-31 LAB — CBC
HCT: 29.8 % — ABNORMAL LOW (ref 39.0–52.0)
HCT: 30.6 % — ABNORMAL LOW (ref 39.0–52.0)
Hemoglobin: 10.2 g/dL — ABNORMAL LOW (ref 13.0–17.0)
Hemoglobin: 10.6 g/dL — ABNORMAL LOW (ref 13.0–17.0)
MCH: 29.2 pg (ref 26.0–34.0)
MCH: 29 pg (ref 26.0–34.0)
MCHC: 34.2 g/dL (ref 30.0–36.0)
MCHC: 34.6 g/dL (ref 30.0–36.0)
MCV: 85.4 fL (ref 80.0–100.0)
MCV: 83.8 fL (ref 80.0–100.0)
Platelets: 407 10*3/uL — ABNORMAL HIGH (ref 150–400)
Platelets: 460 10*3/uL — ABNORMAL HIGH (ref 150–400)
RBC: 3.49 MIL/uL — ABNORMAL LOW (ref 4.22–5.81)
RBC: 3.65 MIL/uL — ABNORMAL LOW (ref 4.22–5.81)
RDW: 13.8 % (ref 11.5–15.5)
RDW: 13.8 % (ref 11.5–15.5)
WBC: 22.4 10*3/uL — ABNORMAL HIGH (ref 4.0–10.5)
WBC: 15.7 10*3/uL — ABNORMAL HIGH (ref 4.0–10.5)
nRBC: 0 % (ref 0.0–0.2)
nRBC: 0 % (ref 0.0–0.2)

## 2021-03-31 LAB — BASIC METABOLIC PANEL
Anion gap: 8 (ref 5–15)
BUN: 7 mg/dL (ref 6–20)
CO2: 24 mmol/L (ref 22–32)
Calcium: 7.9 mg/dL — ABNORMAL LOW (ref 8.9–10.3)
Chloride: 106 mmol/L (ref 98–111)
Creatinine, Ser: 0.6 mg/dL — ABNORMAL LOW (ref 0.61–1.24)
GFR, Estimated: >60 mL/min (ref >60)
Glucose, Bld: 73 mg/dL (ref 70–99)
Potassium: 3.2 mmol/L — ABNORMAL LOW (ref 3.5–5.1)
Sodium: 138 mmol/L (ref 135–145)

## 2021-03-31 LAB — PROCALCITONIN: Procalcitonin: 12.31 ng/mL

## 2021-03-31 LAB — ACID FAST SMEAR (AFB, MYCOBACTERIA): Acid Fast Smear: NEGATIVE

## 2021-03-31 LAB — MRSA NEXT GEN BY PCR, NASAL: MRSA by PCR Next Gen: DETECTED — AB

## 2021-03-31 LAB — MAGNESIUM: Magnesium: 1.6 mg/dL — ABNORMAL LOW (ref 1.7–2.4)

## 2021-03-31 MED ORDER — MAGNESIUM SULFATE 2 GM/50ML IV SOLN
2.0000 g | Freq: Once | INTRAVENOUS | Status: AC
  Administered 2021-03-31: 2 g via INTRAVENOUS
  Filled 2021-03-31: qty 50

## 2021-03-31 MED ORDER — SODIUM CHLORIDE 0.9% FLUSH
10.0000 mL | Freq: Three times a day (TID) | INTRAVENOUS | Status: DC
  Administered 2021-03-31 – 2021-04-02 (×5): 10 mL

## 2021-03-31 MED ORDER — SODIUM CHLORIDE (PF) 0.9 % IJ SOLN
10.0000 mg | Freq: Once | INTRAMUSCULAR | Status: AC
  Administered 2021-03-31: 10 mg via INTRAPLEURAL
  Filled 2021-03-31: qty 10

## 2021-03-31 MED ORDER — POTASSIUM CHLORIDE CRYS ER 20 MEQ PO TBCR
40.0000 meq | EXTENDED_RELEASE_TABLET | Freq: Once | ORAL | Status: AC
  Administered 2021-03-31: 40 meq via ORAL
  Filled 2021-03-31: qty 2

## 2021-03-31 MED ORDER — CHLORHEXIDINE GLUCONATE CLOTH 2 % EX PADS
6.0000 | MEDICATED_PAD | Freq: Every day | CUTANEOUS | Status: DC
  Administered 2021-04-01: 6 via TOPICAL

## 2021-03-31 MED ORDER — INFLUENZA VAC SPLIT QUAD 0.5 ML IM SUSY
0.5000 mL | PREFILLED_SYRINGE | INTRAMUSCULAR | Status: AC
  Administered 2021-04-02: 0.5 mL via INTRAMUSCULAR
  Filled 2021-03-31: qty 0.5

## 2021-03-31 MED ORDER — STERILE WATER FOR INJECTION IJ SOLN
5.0000 mg | Freq: Once | RESPIRATORY_TRACT | Status: AC
  Administered 2021-03-31: 5 mg via INTRAPLEURAL
  Filled 2021-03-31: qty 5

## 2021-03-31 MED ORDER — SODIUM CHLORIDE 0.9 % IV SOLN
3.0000 g | Freq: Four times a day (QID) | INTRAVENOUS | Status: DC
  Administered 2021-03-31 – 2021-04-02 (×6): 3 g via INTRAVENOUS
  Filled 2021-03-31 (×3): qty 8
  Filled 2021-03-31 (×4): qty 3
  Filled 2021-03-31 (×2): qty 8

## 2021-03-31 MED ORDER — DIPHENHYDRAMINE HCL 25 MG PO CAPS
25.0000 mg | ORAL_CAPSULE | Freq: Once | ORAL | Status: AC
  Administered 2021-03-31: 25 mg via ORAL
  Filled 2021-03-31: qty 1

## 2021-03-31 MED ORDER — MUPIROCIN 2 % EX OINT
1.0000 "application " | TOPICAL_OINTMENT | Freq: Two times a day (BID) | CUTANEOUS | Status: DC
  Administered 2021-03-31 – 2021-04-02 (×4): 1 via NASAL
  Filled 2021-03-31: qty 22

## 2021-03-31 NOTE — Procedures (Signed)
Instillation of tPA/dornase into the chest tube:   Chest tube was clamped, port was cleaned with alcohol swab. 10 mg of alteplase was given to chest tube, followed by 5 mg of dornase.  Chest tube will remain clamped for 1 hour, will reopen after 1 hour and assess.  Patient tolerated procedure well     Nathan Jalina Blowers MD [place] [facility] See Amion for pager If no response to pager, please call [phone] until 7pm After 7pm, Please call E-link [phone]  

## 2021-03-31 NOTE — Procedures (Signed)
Instillation of tPA/dornase into the chest tube:   Chest tube was clamped, port was cleaned with alcohol swab. 10 mg of alteplase was given to chest tube, followed by 5 mg of dornase.  Chest tube will remain clamped for 1 hour, will reopen after 1 hour and assess.  Patient tolerated procedure well     Yul Diana MD [place] [facility] See Amion for pager If no response to pager, please call [phone] until 7pm After 7pm, Please call E-link [phone]  

## 2021-03-31 NOTE — ED Notes (Signed)
Aprox 300 ml of bloody drainage from chest tube since 730am

## 2021-03-31 NOTE — Progress Notes (Signed)
NAME:  Connor Williams, MRN:  [id number], DOB:  November 29, 2000, LOS: 2 ADMISSION DATE:  03/29/2021, CONSULTATION DATE:  03/29/2021 REFERRING MD:  Dr. Clyde Lundborg, CHIEF COMPLAINT:  Chest pain and Shortness of Breath   Brief Pt Description / Synopsis:  20 y.o. Male admitted with Sepsis due to Community Acquired Pneumonia with large Left sided partially loculated pleural effusion concerning for Empyema.  PCCM consulted for chest tube placement. ID following.  May require transfer to [facility] for CT Surgery and Decortication.  History of Present Illness:  Connor Williams is a 20 year old male with a past medical history significant for asthma, tobacco abuse, and alcohol use who presented to [facility] ED on 03/29/2021 due to complaints of chest pain and shortness of breath.    Patient has been incarcerated for the past 3 weeks, and reports he developed chest pain approximately 2 weeks ago.  The chest pain is described as located in the left side, persistent, sharp, pleuritic, and aggravated by deep breaths.  He also reported associated shortness of breath and mild intermittent nonproductive cough.  He reports that approximately 2 days ago he woke up drenched in sweat.  He denies dizziness, palpitations, abdominal pain, nausea, vomiting, diarrhea, dysuria.  He denies IV drug use, does report drinking 6 beers every day prior to going to jail.  ED Course: Initial vital signs: Temperature 99.3 orally, respiratory rate 36, pulse 128, blood pressure 119/69, SPO2 98% on room air Significant Labs: Sodium 128, WBC 44.8 with neutrophilia, hemoglobin 12.3, hematocrit 35.3, platelets 461 serum acetaminophen and less than 10, salicylates less than 7, lactic acid 1.8 SARS-CoV-2 and influenza PCR negative Urine drug screen is negative Imaging: Chest x-ray>>Large left pleural effusion with opacified or obscured underlying lung. Suggest CT with contrast.. CT chest>>Large partially loculated left pleural effusion of unknown etiology.  Compressive atelectasis versus pneumonia in the left lower lobe and lingula. Medications given: 2.5 L of LR boluses, ceftriaxone x1 dose, cefepime, Flagyl, vancomycin  Thoracentesis was performed by IR which yielded 150 cc of yellow colored fluid (effusion noted to be severely loculated and therefore unable to drain fully).  Preliminary pleural fluid analysis is consistent for exudate.  PCCM was consulted for chest tube placement.  CT surgery at [facility] has been contacted, patient may need transfer to [facility] for decortication.  Pertinent  Medical History  Asthma Tobacco Abuse Alcohol use  Micro Data:  03/29/2021: SARS-CoV-2 and influenza PCR>> negative 03/29/2021: Blood culture x2>> 03/29/2021: Pleural fluid>> 03/29/2021: Acid-fast smear pleural fluid>>negative 03/29/2021: Acid-fast culture from pleural fluid>> 03/29/2021: Strep pneumo urinary antigen>>negative 03/29/2021: Legionella urinary antigen>>negative  Antimicrobials:  Ceftriaxone 12/6 x1 dose Cefepime 12/6 Flagyl 12/6>> Vancomycin 12/6>>  Significant Hospital Events: Including procedures, antibiotic start and stop dates in addition to other pertinent events   03/29/21: Presented to ED with SOB and chest pain.  Found to have Pneumonia with large left partially pleural effusion suspicious for Empyema. Thoracentesis performed by IR. PCCM consulted for chest tube placement.  ID consulted 03/30/21: Requiring low dose pressors (Levophed 3 mcg), will give additional 1L LR bolus and assess fluid response.  Instillation of tPA/Dornase into chest tube, plan to repeat this evening 03/31/21: Weaned off vasopressors.  Instillation of tPA/Dornase into chest tube this morning, plans to repeat this evening  Interim History / Subjective:  -No significant events noted overnight -tPA/Dornase was instilled into chest tube last night and again this morning -Afebrile, hemodynamically stable, WEANED OFF VASOPRESSORS -This morning denies chest pain  or SOB, on  room air -CXR this morning with improvement in size of empyema   -May ultimately require transfer to [facility] for CT Surgery and Decortication   Objective   Blood pressure 109/66, pulse (!) 111, temperature 98 F (36.7 C), temperature source Oral, resp. rate (!) 22, weight 65.8 kg, SpO2 98 %.        Intake/Output Summary (Last 24 hours) at 03/31/2021 0949 Last data filed at 03/31/2021 [id number] Gross per 24 hour  Intake 2462.41 ml  Output 1650 ml  Net 812.41 ml    Filed Weights   03/29/21 0900  Weight: 65.8 kg    Examination: General: Acutely ill-appearing male, laying in bed, on room air, no acute distress HENT: Atraumatic, normocephalic, neck supple, no JVD Lungs: Clear breath sounds to the right and left upper zone, diminished breath sounds to left lower zone, even, nonlabored Cardiovascular: Tachycardia, regular rhythm (sinus tachycardia on telemetry), no murmurs, rubs, gallops, 2+ distal pulses Abdomen: Soft, nontender, nondistended, no guarding rebound tenderness, bowel sounds positive x4 Extremities: No deformities, no edema, normal bulk and tone Neuro: Awake and alert, oriented x4, follows commands, no focal deficits, speech clear, pupils PERRLA GU: Deferred  Resolved Hospital Problem list     Assessment & Plan:   Septic Shock ~ RESOLVED -Continuous cardiac monitoring -Maintain MAP >65 -IV fluids  -Vasopressors as needed to maintain MAP goal ~ weaned off -Lactic acid normalized (1.1 ~ 1.1) -HS Troponin negative x2 (9 ~ 9)  Severe Sepsis in the setting of Pneumonia with Large Left partially loculated Pleural Effusion concerning for EMPYEMA (Meets SIRS Criteria: WBC 44.8, HR 130's, RR 37) -Monitor fever curve -Trend WBC's & Procalcitonin -Follow cultures as above -ID following, appreciate input -Continue empiric Cefepime, Flagyl, Vancomycin pending cultures & sensitivities ~ will defer to ID -Thoracentesis performed 12/5 by IR ~ yielded 150 cc of  yellow colored fluid (effusion noted to be severely loculated and therefore unable to drain fully) ~preliminary analysis consistent with EMPYEMA -Chest tube placement by Dr. Tacy Learn 12/6 -Instillation of tPA/Dornase into chest tube on 12/8 ~ plan to repeat this evening -Follow up CXR in the AM ~ may ultimately require transfer to [facility] for CT Surgery and decortication  Hypokalemia -Monitor I&O's / urinary output -Follow BMP -Ensure adequate renal perfusion -Avoid nephrotoxic agents as able -Replace electrolytes as indicated       Best Practice (right click and "Reselect all SmartList Selections" daily)   Diet/type: Regular consistency (see orders) DVT prophylaxis: SCD GI prophylaxis: N/A Lines: N/A Foley:  N/A Code Status:  full code Last date of multidisciplinary goals of care discussion [N/A]  Labs   CBC: Recent Labs  Lab 03/29/21 0415 03/29/21 0825 03/30/21 0703 03/31/21 0442  WBC 44.8* 44.8* 30.5*  31.8* 22.4*  NEUTROABS  --  39.2*  --   --   HGB 12.3* 12.2* 10.3*  10.1* 10.2*  HCT 35.3* 35.8* 29.6*  29.2* 29.8*  MCV 84.7 85.2 84.3  83.7 85.4  PLT 461* 485* 417*  421* 407*     Basic Metabolic Panel: Recent Labs  Lab 03/29/21 0415 03/29/21 1747 03/31/21 0442  NA 128* 134* 138  K 3.8 3.3* 3.2*  CL 93* 102 106  CO2 26 25 24   GLUCOSE 103* 119* 73  BUN 13 10 7   CREATININE 1.01 0.95 0.60*  CALCIUM 8.5* 7.8* 7.9*  MG  --   --  1.6*    GFR: Estimated Creatinine Clearance: 131.8 mL/min (A) (by C-G formula based on SCr of  0.6 mg/dL (L)). Recent Labs  Lab 03/29/21 0415 03/29/21 0825 03/29/21 1003 03/29/21 1146 03/29/21 1747 03/29/21 2127 03/30/21 0703 03/30/21 0705 03/31/21 0442  PROCALCITON  --   --   --   --  22.48  --   --  18.30 12.31  WBC 44.8* 44.8*  --   --   --   --  30.5*  31.8*  --  22.4*  LATICACIDVEN  --   --  1.8 1.8  --  1.1 1.1  --   --      Liver Function Tests: Recent Labs  Lab 03/29/21 1747  ALBUMIN 2.3*    No  results for input(s): LIPASE, AMYLASE in the last 168 hours. No results for input(s): AMMONIA in the last 168 hours.  ABG    Component Value Date/Time   PHART 7.29 (L) 09/23/2016 0937   PCO2ART 57 (H) 09/23/2016 0937   PO2ART 182 (H) 09/23/2016 0937   HCO3 27.4 09/23/2016 0937   ACIDBASEDEF 0.6 09/23/2016 0937   O2SAT 99.5 09/23/2016 0937      Coagulation Profile: Recent Labs  Lab 03/29/21 1747  INR 1.8*     Cardiac Enzymes: No results for input(s): CKTOTAL, CKMB, CKMBINDEX, TROPONINI in the last 168 hours.  HbA1C: No results found for: HGBA1C  CBG: Recent Labs  Lab 03/29/21 1336  GLUCAP 86     Review of Systems:   Positives in BOLD: Pt currently denies all complaints Gen: Denies fever, chills, weight change, fatigue, night sweats HEENT: Denies blurred vision, double vision, hearing loss, tinnitus, sinus congestion, rhinorrhea, sore throat, neck stiffness, dysphagia PULM: Denies shortness of breath, cough, sputum production, hemoptysis, wheezing CV: Denies chest pain, edema, orthopnea, paroxysmal nocturnal dyspnea, palpitations GI: Denies abdominal pain, nausea, vomiting, diarrhea, hematochezia, melena, constipation, change in bowel habits GU: Denies dysuria, hematuria, polyuria, oliguria, urethral discharge Endocrine: Denies hot or cold intolerance, polyuria, polyphagia or appetite change Derm: Denies rash, dry skin, scaling or peeling skin change Heme: Denies easy bruising, bleeding, bleeding gums Neuro: Denies headache, numbness, weakness, slurred speech, loss of memory or consciousness   Past Medical History:  He,  has a past medical history of Alcohol use, Asthma, and Tobacco abuse.   Surgical History:  History reviewed. No pertinent surgical history.   Social History:   reports that he has been smoking cigarettes. He has a 3.00 pack-year smoking history. He has never used smokeless tobacco. He reports current alcohol use. He reports current drug use.  Drug: Marijuana.   Family History:  His family history includes Alcohol abuse in his father; Hypertension in his mother; Hypothyroidism in his mother.   Allergies No Known Allergies   Home Medications  Prior to Admission medications   Not on File     Care time: 40 minutes     Darel Hong, AGACNP-BC [employer] epic messenger for cross cover needs If after hours, please call E-link

## 2021-03-31 NOTE — ED Notes (Signed)
Informed RN bed assigned 

## 2021-03-31 NOTE — ED Notes (Signed)
[facility] NP at bedside to unclamp chest tube, NP states that tube is draining appropriately.  Chest tube is a 20cm suction.  Pt reports increasing general/ back and leg pain, requests pain med

## 2021-03-31 NOTE — ED Notes (Addendum)
ED TO INPATIENT HANDOFF REPORT  ED Nurse Name and Phone #: Feliz Beam [phone]  S Name/Age/Gender Connor Williams 20 y.o. male Room/Bed: ED15A/ED15A  Code Status   Code Status: Full Code  Home/SNF/Other Home/ jail Patient oriented to: self, place, time, and situation Is this baseline? Yes   Triage Complete: Triage complete  Chief Complaint CAP (community acquired pneumonia) due to Escherichia coli (HCC) [J15.5] CAP (community acquired pneumonia) [J18.9] Empyema (HCC) [J86.9] Sepsis (HCC) [A41.9]  Triage Note Pt [facility] where pt c/o left sided chest pain that radiates into back x1 week. Pt reports intermittent SOB but denies currently. Pt denies cough.     Allergies No Known Allergies  Level of Care/Admitting Diagnosis ED Disposition     ED Disposition  Admit   Condition  --   Comment  Hospital Area: [facility] [[id number]]  Level of Care: Med-Surg [16]  Covid Evaluation: Confirmed COVID Negative  Diagnosis: Sepsis [facility]) [[id number]]  Admitting Physician: Alford Highland [[id number]]  Attending Physician: Alford Highland [phone]  Estimated length of stay: past midnight tomorrow  Certification:: I certify this patient will need inpatient services for at least 2 midnights          B Medical/Surgery History Past Medical History:  Diagnosis Date   Alcohol use    Asthma    Tobacco abuse    History reviewed. No pertinent surgical history.   A IV Location/Drains/Wounds Patient Lines/Drains/Airways Status     Active Line/Drains/Airways     Name Placement date Placement time Site Days   Peripheral IV 03/30/21 20 G Anterior;Proximal;Right Forearm 03/30/21  2157  Forearm  1   Chest Tube Left Pleural 14 Fr. 03/29/21  1627  Pleural  2            Intake/Output Last 24 hours  Intake/Output Summary (Last 24 hours) at 03/31/2021 1350 Last data filed at 03/31/2021 1338 Gross per 24 hour  Intake 1708.05 ml  Output 2350 ml   Net -641.95 ml    Labs/Imaging Results for orders placed or performed during the hospital encounter of 03/29/21 (from the past 48 hour(s))  Osmolality     Status: None   Collection Time: 03/29/21  5:47 PM  Result Value Ref Range   Osmolality 285 275 - 295 mOsm/kg    Comment: REPEATED TO VERIFY Performed at [facility], [address] [place], [place] [address]   Basic metabolic panel     Status: Abnormal   Collection Time: 03/29/21  5:47 PM  Result Value Ref Range   Sodium 134 (L) 135 - 145 mmol/L   Potassium 3.3 (L) 3.5 - 5.1 mmol/L   Chloride 102 98 - 111 mmol/L   CO2 25 22 - 32 mmol/L   Glucose, Bld 119 (H) 70 - 99 mg/dL    Comment: Glucose reference range applies only to samples taken after fasting for at least 8 hours.   BUN 10 6 - 20 mg/dL   Creatinine, Ser 6.25 0.61 - 1.24 mg/dL   Calcium 7.8 (L) 8.9 - 10.3 mg/dL   GFR, Estimated >63 >89 mL/min    Comment: (NOTE) Calculated using the CKD-EPI Creatinine Equation (2021)    Anion gap 7 5 - 15    Comment: Performed at [facility], [address] [place], [place] [address]  Protime-INR     Status: Abnormal   Collection Time: 03/29/21  5:47 PM  Result Value Ref Range   Prothrombin Time 20.5 (H)  11.4 - 15.2 seconds   INR 1.8 (H) 0.8 - 1.2    Comment: (NOTE) INR goal varies based on device and disease states. Performed at [facility], [address] [place], [place] [address]   Procalcitonin     Status: None   Collection Time: 03/29/21  5:47 PM  Result Value Ref Range   Procalcitonin 22.48 ng/mL    Comment:        Interpretation: PCT >= 10 ng/mL: Important systemic inflammatory response, almost exclusively due to severe bacterial sepsis or septic shock. (NOTE)       Sepsis PCT Algorithm           Lower Respiratory Tract                                      Infection PCT Algorithm    ----------------------------     ----------------------------         PCT < 0.25 ng/mL                 PCT < 0.10 ng/mL          Strongly encourage             Strongly discourage   discontinuation of antibiotics    initiation of antibiotics    ----------------------------     -----------------------------       PCT 0.25 - 0.50 ng/mL            PCT 0.10 - 0.25 ng/mL               OR       >80% decrease in PCT            Discourage initiation of                                            antibiotics      Encourage discontinuation           of antibiotics    ----------------------------     -----------------------------         PCT >= 0.50 ng/mL              PCT 0.26 - 0.50 ng/mL                AND       <80% decrease in PCT             Encourage initiation of                                             antibiotics       Encourage continuation           of antibiotics    ----------------------------     -----------------------------        PCT >= 0.50 ng/mL                  PCT > 0.50 ng/mL               AND         increase in PCT  Strongly encourage                                      initiation of antibiotics    Strongly encourage escalation           of antibiotics                                     -----------------------------                                           PCT <= 0.25 ng/mL                                                 OR                                        > 80% decrease in PCT                                      Discontinue / Do not initiate                                             antibiotics  Performed at [facility], [address] [place], [place] [address]   Albumin, Pleural Fluid     Status: Abnormal   Collection Time: 03/29/21  5:47 PM  Result Value Ref Range   Albumin 2.3 (L) 3.5 - 5.0 g/dL    Comment: Performed at [facility], [address] [place], [place] [address]  Lactic acid, plasma     Status: None   Collection Time: 03/29/21  9:27 PM  Result Value Ref Range   Lactic Acid, Venous 1.1  0.5 - 1.9 mmol/L    Comment: Performed at [facility], [address] [place], [place] [address]  CBC     Status: Abnormal   Collection Time: 03/30/21  7:03 AM  Result Value Ref Range   WBC 31.8 (H) 4.0 - 10.5 K/uL   RBC 3.49 (L) 4.22 - 5.81 MIL/uL   Hemoglobin 10.1 (L) 13.0 - 17.0 g/dL   HCT 10.2 (L) 72.5 - 36.6 %   MCV 83.7 80.0 - 100.0 fL   MCH 28.9 26.0 - 34.0 pg   MCHC 34.6 30.0 - 36.0 g/dL   RDW 44.0 34.7 - 42.5 %   Platelets 421 (H) 150 - 400 K/uL   nRBC 0.0 0.0 - 0.2 %    Comment: Performed at [facility] [address] [place], [place] [address]  Lactic acid, plasma     Status: None   Collection Time: 03/30/21  7:03 AM  Result Value Ref Range   Lactic Acid, Venous 1.1 0.5 - 1.9 mmol/L    Comment: Performed at [facility], [address] [place], [place] [address]  CBC     Status: Abnormal   Collection Time: 03/30/21  7:03 AM  Result Value Ref Range   WBC 30.5 (H) 4.0 - 10.5 K/uL   RBC 3.51 (L) 4.22 - 5.81 MIL/uL   Hemoglobin 10.3 (L) 13.0 - 17.0 g/dL   HCT 60.4 (L) 54.0 - 98.1 %   MCV 84.3 80.0 - 100.0 fL   MCH 29.3 26.0 - 34.0 pg   MCHC 34.8 30.0 - 36.0 g/dL   RDW 19.1 47.8 - 29.5 %   Platelets 417 (H) 150 - 400 K/uL   nRBC 0.0 0.0 - 0.2 %    Comment: Performed at [facility], [address] [place], [place] [address]  Procalcitonin - Baseline     Status: None   Collection Time: 03/30/21  7:05 AM  Result Value Ref Range   Procalcitonin 18.30 ng/mL    Comment:        Interpretation: PCT >= 10 ng/mL: Important systemic inflammatory response, almost exclusively due to severe bacterial sepsis or septic shock. (NOTE)       Sepsis PCT Algorithm           Lower Respiratory Tract                                      Infection PCT Algorithm    ----------------------------     ----------------------------         PCT < 0.25 ng/mL                PCT < 0.10 ng/mL          Strongly encourage             Strongly  discourage   discontinuation of antibiotics    initiation of antibiotics    ----------------------------     -----------------------------       PCT 0.25 - 0.50 ng/mL            PCT 0.10 - 0.25 ng/mL               OR       >80% decrease in PCT            Discourage initiation of                                            antibiotics      Encourage discontinuation           of antibiotics    ----------------------------     -----------------------------         PCT >= 0.50 ng/mL              PCT 0.26 - 0.50 ng/mL                AND       <80% decrease in PCT             Encourage initiation of                                             antibiotics       Encourage continuation  of antibiotics    ----------------------------     -----------------------------        PCT >= 0.50 ng/mL                  PCT > 0.50 ng/mL               AND         increase in PCT                  Strongly encourage                                      initiation of antibiotics    Strongly encourage escalation           of antibiotics                                     -----------------------------                                           PCT <= 0.25 ng/mL                                                 OR                                        > 80% decrease in PCT                                      Discontinue / Do not initiate                                             antibiotics  Performed at [facility], [address] [place], [place] [address]   Procalcitonin     Status: None   Collection Time: 03/31/21  4:42 AM  Result Value Ref Range   Procalcitonin 12.31 ng/mL    Comment:        Interpretation: PCT >= 10 ng/mL: Important systemic inflammatory response, almost exclusively due to severe bacterial sepsis or septic shock. (NOTE)       Sepsis PCT Algorithm           Lower Respiratory Tract                                      Infection PCT Algorithm     ----------------------------     ----------------------------         PCT < 0.25 ng/mL                PCT < 0.10 ng/mL          Strongly encourage  Strongly discourage   discontinuation of antibiotics    initiation of antibiotics    ----------------------------     -----------------------------       PCT 0.25 - 0.50 ng/mL            PCT 0.10 - 0.25 ng/mL               OR       >80% decrease in PCT            Discourage initiation of                                            antibiotics      Encourage discontinuation           of antibiotics    ----------------------------     -----------------------------         PCT >= 0.50 ng/mL              PCT 0.26 - 0.50 ng/mL                AND       <80% decrease in PCT             Encourage initiation of                                             antibiotics       Encourage continuation           of antibiotics    ----------------------------     -----------------------------        PCT >= 0.50 ng/mL                  PCT > 0.50 ng/mL               AND         increase in PCT                  Strongly encourage                                      initiation of antibiotics    Strongly encourage escalation           of antibiotics                                     -----------------------------                                           PCT <= 0.25 ng/mL                                                 OR                                        >  80% decrease in PCT                                      Discontinue / Do not initiate                                             antibiotics  Performed at [facility], [address] [place], [place] [address]   CBC     Status: Abnormal   Collection Time: 03/31/21  4:42 AM  Result Value Ref Range   WBC 22.4 (H) 4.0 - 10.5 K/uL   RBC 3.49 (L) 4.22 - 5.81 MIL/uL   Hemoglobin 10.2 (L) 13.0 - 17.0 g/dL   HCT 19.1 (L) 47.8 - 29.5 %   MCV 85.4 80.0 - 100.0 fL   MCH 29.2  26.0 - 34.0 pg   MCHC 34.2 30.0 - 36.0 g/dL   RDW 62.1 30.8 - 65.7 %   Platelets 407 (H) 150 - 400 K/uL   nRBC 0.0 0.0 - 0.2 %    Comment: Performed at [facility], [address] [place], [place] [address]  Basic metabolic panel     Status: Abnormal   Collection Time: 03/31/21  4:42 AM  Result Value Ref Range   Sodium 138 135 - 145 mmol/L   Potassium 3.2 (L) 3.5 - 5.1 mmol/L   Chloride 106 98 - 111 mmol/L   CO2 24 22 - 32 mmol/L   Glucose, Bld 73 70 - 99 mg/dL    Comment: Glucose reference range applies only to samples taken after fasting for at least 8 hours.   BUN 7 6 - 20 mg/dL   Creatinine, Ser 2.95 (L) 0.61 - 1.24 mg/dL   Calcium 7.9 (L) 8.9 - 10.3 mg/dL   GFR, Estimated >28 >41 mL/min    Comment: (NOTE) Calculated using the CKD-EPI Creatinine Equation (2021)    Anion gap 8 5 - 15    Comment: Performed at [facility], [address] [place], [place] [address]  Magnesium     Status: Abnormal   Collection Time: 03/31/21  4:42 AM  Result Value Ref Range   Magnesium 1.6 (L) 1.7 - 2.4 mg/dL    Comment: Performed at [facility], [address] [place], [place] [address]  MRSA Next Gen by PCR, Nasal     Status: Abnormal   Collection Time: 03/31/21  4:50 AM   Specimen: Nasal Mucosa; Nasal Swab  Result Value Ref Range   MRSA by PCR Next Gen DETECTED (A) NOT DETECTED    Comment: RESULT CALLED TO, READ BACK BY AND VERIFIED WITH: Fuller Canada RN [phone] 03/31/21 HNM (NOTE) The GeneXpert MRSA Assay (FDA approved for NASAL specimens only), is one component of a comprehensive MRSA colonization surveillance program. It is not intended to diagnose MRSA infection nor to guide or monitor treatment for MRSA infections. Test performance is not FDA approved in patients less than 66 years old. Performed at [facility], [address] [place], [place] [address]    DG Chest 1 View  Result Date: 03/30/2021 CLINICAL DATA:  20 year old male  with history of empyema. EXAM: CHEST  1 VIEW COMPARISON:  Chest x-ray 03/29/2021. FINDINGS: Small bore left-sided chest tube remains in position with tip in the medial  aspect of the lower left hemithorax. Previously noted left-sided pleural effusion has increased, with worsening areas of atelectasis and/or consolidation in the lower left lung. Right lung is clear. No right pleural effusion. No pneumothorax. No evidence of pulmonary edema. Heart size is normal. Upper mediastinal contours are within normal limits. IMPRESSION: 1. Enlarging left pleural fluid collection compatible with reported empyema. Left-sided chest tube is stable in position. Electronically Signed   By: Trudie Reed M.D.   On: 03/30/2021 05:23   DG Chest Port 1 View  Result Date: 03/31/2021 CLINICAL DATA:  Follow-up empyema EXAM: PORTABLE CHEST 1 VIEW COMPARISON:  03/30/2021 FINDINGS: Cardiac shadow is stable. Right lung is clear. Pigtail catheter is noted in place on the left with significant reduction in pleural fluid when compare with the prior exam. A mild lateral pneumothorax is noted related incomplete re-expansion of the left lung. No bony abnormality is noted. IMPRESSION: Decrease in left-sided pleural fluid with pigtail catheter in place. Incomplete re-expansion of the left lung is noted with a lateral pneumothorax. Electronically Signed   By: Alcide Clever M.D.   On: 03/31/2021 03:18   DG Chest Port 1 View  Result Date: 03/29/2021 CLINICAL DATA:  Dyspnea. EXAM: PORTABLE CHEST 1 VIEW COMPARISON:  4:40 p.m. FINDINGS: Left basilar pigtail chest tube is unchanged in position, likely in the costophrenic angle. Large left pleural effusion is unchanged in size with compressive atelectasis of the left lower lobe. Small rounded foci of gas are nonspecific in the setting of chest tube placement and may represent loculated gas within the pleural space or cavitation in the setting of necrotizing pneumonia. Right lung is clear. No  pneumothorax. No pleural effusion on the right. Cardiac size within normal limits. IMPRESSION: Stable left basilar chest tube. Unchanged large left pleural effusion. Interval development of a small foci of gas within the left lung base possibly representing loculated pleural gas or cavitation in the setting of necrotizing pneumonia. Electronically Signed   By: Helyn Numbers M.D.   On: 03/29/2021 22:10   DG Chest Portable 1 View  Result Date: 03/29/2021 CLINICAL DATA:  Chest tube placement. EXAM: PORTABLE CHEST 1 VIEW COMPARISON:  Chest x-ray from same day at 1223 hours. FINDINGS: New left-sided chest tube. Unchanged loculated large left pleural effusion. No pneumothorax. The right lung is clear. The heart size and mediastinal contours are within normal limits. No acute osseous abnormality. IMPRESSION: 1. New left-sided chest tube with unchanged loculated large left pleural effusion. No pneumothorax. Electronically Signed   By: Obie Dredge M.D.   On: 03/29/2021 16:48    Pending Labs Unresulted Labs (From admission, onward)     Start     Ordered   03/31/21 0500  Procalcitonin  Daily,   STAT      03/30/21 0934   03/31/21 0500  CBC  Daily,   STAT      03/30/21 0934   03/31/21 0500  Basic metabolic panel  Daily,   STAT      03/30/21 0934   03/29/21 1424  Acid Fast Culture with reflexed sensitivities  (AFB smear + Culture w reflexed sensitivities panel)  Add-on,   AD       See Hyperspace for full Linked Orders Report.   03/29/21 1424   03/29/21 1126  Expectorated Sputum Assessment w Gram Stain, Rflx to Resp Cult  Once,   STAT        03/29/21 1125   03/29/21 1111  Cholesterol, Pleural Fluid  (Bedside Thoracentesis)  Once,  STAT        03/29/21 1111   03/29/21 1111  Miscellaneous [facility] test (send-out)  Once,   R        03/29/21 1111            Vitals/Pain Today's Vitals   03/31/21 1300 03/31/21 1303 03/31/21 1330 03/31/21 1345  BP: 114/84  (!) 152/79 121/69  Pulse: (!) 103  (!)  106 (!) 104  Resp: (!) 21  (!) 25 (!) 24  Temp:    98.4 F (36.9 C)  TempSrc:    Oral  SpO2: 98%  97% 97%  Weight:      PainSc:  5   2     Isolation Precautions No active isolations  Medications Medications  dextromethorphan-guaiFENesin (MUCINEX DM) 30-600 MG per 12 hr tablet 1 tablet (has no administration in time range)  ondansetron (ZOFRAN) injection 4 mg (4 mg Intravenous Given 03/29/21 2009)  albuterol (PROVENTIL) (2.5 MG/3ML) 0.083% nebulizer solution 2.5 mg (2.5 mg Nebulization Given 03/29/21 2159)  nicotine (NICODERM CQ - dosed in mg/24 hours) patch 21 mg (21 mg Transdermal Patch Applied 03/31/21 0900)  ceFEPIme (MAXIPIME) 2 g in sodium chloride 0.9 % 100 mL IVPB (0 g Intravenous Stopped 03/31/21 1337)  metroNIDAZOLE (FLAGYL) IVPB 500 mg (0 mg Intravenous Stopped 03/31/21 1303)  folic acid (FOLVITE) tablet 1 mg (1 mg Oral Given 03/31/21 1025)  thiamine tablet 100 mg (100 mg Oral Given 03/31/21 1025)  HYDROmorphone (DILAUDID) injection 1 mg (1 mg Intravenous Given 03/30/21 0851)  acetaminophen (TYLENOL) tablet 650 mg (650 mg Oral Given 03/31/21 1339)  oxyCODONE (Oxy IR/ROXICODONE) immediate release tablet 10 mg (10 mg Oral Given 03/31/21 1211)  0.9 %  sodium chloride infusion (0 mLs Intravenous Stopped 03/31/21 1023)  norepinephrine (LEVOPHED) 4mg  in (0.016 mg/mL) premix infusion (0 mcg/min Intravenous Stopped 03/31/21 0215)  sodium chloride flush (NS) 0.9 % injection 10 mL (10 mLs Other Given by Other 03/31/21 0947)  vancomycin (VANCOREADY) IVPB 1250 mg/250 mL (1,250 mg Intravenous New Bag/Given 03/31/21 1341)  pantoprazole (PROTONIX) EC tablet 40 mg (40 mg Oral Given 03/31/21 1025)  sodium chloride flush (NS) 0.9 % injection 10 mL (10 mLs Other Given by Other 03/31/21 0947)  lactated ringers bolus 1,000 mL (0 mLs Intravenous Stopped 03/29/21 1324)  iohexol (OMNIPAQUE) 300 MG/ML solution 75 mL (75 mLs Intravenous Contrast Given 03/29/21 0903)  cefTRIAXone (ROCEPHIN) 1 g in sodium  chloride 0.9 % 100 mL IVPB (0 g Intravenous Stopped 03/29/21 1135)  vancomycin (VANCOREADY) IVPB 1500 mg/300 mL (0 mg Intravenous Stopped 03/29/21 1515)  ketorolac (TORADOL) 30 MG/ML injection 15 mg (15 mg Intravenous Given 03/29/21 1139)  lactated ringers bolus 1,000 mL (0 mLs Intravenous Stopped 03/29/21 1515)  lactated ringers bolus 500 mL (0 mLs Intravenous Stopped 03/29/21 1625)  fentaNYL (SUBLIMAZE) injection 50 mcg (50 mcg Intravenous Given 03/29/21 1608)  midazolam (VERSED) injection 2 mg (2 mg Intravenous Given 03/29/21 1608)  lidocaine-EPINEPHrine (XYLOCAINE W/EPI) 2 %-1:100000 (with pres) injection 20 mL (20 mLs Intradermal Given 03/29/21 1644)  sodium chloride 0.9 % bolus 500 mL (0 mLs Intravenous Stopped 03/29/21 2223)  alteplase (CATHFLO ACTIVASE) 10 mg in sodium chloride (PF) 0.9 % 30 mL (10 mg Intrapleural Given 03/30/21 0912)    And  dornase alfa (PULMOZYME) 5 mg in sterile water (preservative free) 30 mL (5 mg Intrapleural Given 03/30/21 0912)  lactated ringers bolus 1,000 mL (0 mLs Intravenous Stopped 03/30/21 1345)  alteplase (CATHFLO ACTIVASE) 10 mg in sodium chloride (PF) 0.9 %  30 mL (10 mg Intrapleural Given by Other 03/30/21 2110)    And  dornase alfa (PULMOZYME) 5 mg in sterile water (preservative free) 30 mL (5 mg Intrapleural Given by Other 03/30/21 2111)  magnesium sulfate IVPB 2 g 50 mL (0 g Intravenous Stopped 03/31/21 1023)  potassium chloride SA (KLOR-CON M) CR tablet 40 mEq (40 mEq Oral Given 03/31/21 0856)  diphenhydrAMINE (BENADRYL) capsule 25 mg (25 mg Oral Given 03/31/21 0845)  alteplase (CATHFLO ACTIVASE) 10 mg in sodium chloride (PF) 0.9 % 30 mL (10 mg Intrapleural Given by Other 03/31/21 0946)    And  dornase alfa (PULMOZYME) 5 mg in sterile water (preservative free) 30 mL (5 mg Intrapleural Given by Other 03/31/21 0946)    Mobility walks Low fall risk      R Recommendations: See Admitting Provider Note  Report given to:   Additional Notes: Pt has L chest tube  up to suction at 20cm, pt satting well on room air.

## 2021-03-31 NOTE — Progress Notes (Signed)
PCCM Brief Progress Note  S: Chest tube output more grossly bloody after lytics this evening than prior.   O: Tachycardic, BP stable Equal chest rise, resp non-labored Warm  A/P: - dic panel, type/screen - watch output closely, page if sustains 200/hr of bloody output, develops CP, sudden dyspnea, and/or hypotension  - tentatively plan on CT Chest tomorrow, depending on extent of remaining fibrothorax/residual empyema and/or hemothorax may need transfer for consideration of VATS  Laroy Apple Pulmonary/Critical Care

## 2021-03-31 NOTE — Consult Note (Signed)
Pharmacy Antibiotic Note  Connor Williams is a 20 y.o. male admitted on 03/29/2021 with pneumonia with suspected empyema. PMH includes incarceration for the past 3 weeks  Pharmacy has been consulted for Vancomycin and cefepime dosing.  Today, 03/31/2021 Day 3 antibiotics MRSA PCR positive Pleural fluid culture re-incubated Pleural fluid consistent with exudate Receiving alteplase + dornase alfa via chest tube Vancomycin levels 12/9 Vancomycin 1250mg  at 02:00 12/9 Vanco peak at 05:00 12/9 Vanco trough at 13:00  Plan: Continue Cefepime 2 gram Q8H Continue vancomycin 1250 mg q12h Estimated AUC 546 Scr 0.95, IBW, Vd 0.72   Check levels with next vancomycin dose Follow up cultures and de-escalate as able Monitor renal function and adjust dose as clinically indicated  Weight: 65.8 kg (145 lb 1 oz)  Temp (24hrs), Avg:98.2 F (36.8 C), Min:98 F (36.7 C), Max:98.4 F (36.9 C)  Recent Labs  Lab 03/29/21 0415 03/29/21 0825 03/29/21 1003 03/29/21 1146 03/29/21 1747 03/29/21 2127 03/30/21 0703 03/31/21 0442  WBC 44.8* 44.8*  --   --   --   --  30.5*  31.8* 22.4*  CREATININE 1.01  --   --   --  0.95  --   --  0.60*  LATICACIDVEN  --   --  1.8 1.8  --  1.1 1.1  --      Estimated Creatinine Clearance: 131.8 mL/min (A) (by C-G formula based on SCr of 0.6 mg/dL (L)).    No Known Allergies  Antimicrobials this admission: 12/6 ceftriaxone x 1  12/6 cefepime >>  12/6 Vancomycin >> 12/6 Metronidazole >>    Dose adjustments this admission: 12/7 vancomycin 1000 mg q12h > 1250 mg q12h  Microbiology results: 12/6 BCx: NGTD 12/6 plural fluid: pending 12/6 Sputum: sent  12/6 MRSA PCR: Positive  Thank you for allowing pharmacy to be a part of this patient's care.  14/6, PharmD, BCPS Clinical Pharmacist   03/31/2021 2:29 PM

## 2021-03-31 NOTE — ED Notes (Signed)
Rn aware bed assigned 

## 2021-03-31 NOTE — ED Notes (Signed)
Pt in bed, pt reports decreased pain, guard at bedside, pt awaits vanco from pharmacy.

## 2021-03-31 NOTE — ED Notes (Signed)
Pt in bed, pt sinus tach on monitor, pt on O2 sat monitor satting high 90s, resps even and unlabored, pt has some hives on his R shoulder, denies swelling in tongue or lips,

## 2021-03-31 NOTE — Progress Notes (Signed)
Date of Admission:  03/29/2021      ID: Connor Williams is a 20 y.o. male  Principal Problem:   CAP (community acquired pneumonia) Active Problems:   Pleural effusion on left   Hyponatremia   Septic shock (HCC)   Tobacco abuse   Asthma   Alcohol use   Empyema (HCC)   Sepsis (HCC)    Subjective: Pt has pain left side of chest at the site of the tube Had TPA instillation in the tube by Pulmonary today No fever C/o stiffness left knee Shackles removed by officer and he was able to move his leg fully  Medications:   acetaminophen  650 mg Oral Q6H WA   folic acid  1 mg Oral Daily   nicotine  21 mg Transdermal Daily   pantoprazole  40 mg Oral Daily   sodium chloride flush  10 mL Other Q8H   sodium chloride flush  10 mL Other Q8H   thiamine  100 mg Oral Daily    Objective: Vital signs in last 24 hours: Temp:  [98 F (36.7 C)-98.4 F (36.9 C)] 98.4 F (36.9 C) (12/08 1345) Pulse Rate:  [91-111] 104 (12/08 1345) Resp:  [10-28] 24 (12/08 1345) BP: (104-152)/(56-86) 121/69 (12/08 1345) SpO2:  [93 %-100 %] 97 % (12/08 1345)  PHYSICAL EXAM:  General: Alert, cooperative, no distress, appears stated age.  Head: Normocephalic, without obvious abnormality, atraumatic. Eyes: Conjunctivae clear, anicteric sclerae. Pupils are equal ENT Nares normal. No drainage or sinus tenderness. Lips, mucosa, and tongue normal. No Thrush Neck: Supple, symmetrical, no adenopathy, thyroid: non tender no carotid bruit and no JVD. Back: No CVA tenderness. Lungsb/l air entry- decreased left side Pleural tube draining blood stained fluid. Heart: Tachycardia Abdomen: Soft, non-tender,not distended. Bowel sounds normal. No masses Extremities: atraumatic, no cyanosis. No edema. No clubbing, scar over left knee Skin: No rashes or lesions. Or bruising Lymph: Cervical, supraclavicular normal. Neurologic: Grossly non-focal  Lab Results Recent Labs    03/29/21 1747 03/30/21 0703 03/31/21 0442   WBC  --  30.5*  31.8* 22.4*  HGB  --  10.3*  10.1* 10.2*  HCT  --  29.6*  29.2* 29.8*  NA 134*  --  138  K 3.3*  --  3.2*  CL 102  --  106  CO2 25  --  24  BUN 10  --  7  CREATININE 0.95  --  0.60*   Liver Panel Recent Labs    03/29/21 1747  ALBUMIN 2.3*   Sedimentation Rate No results for input(s): ESRSEDRATE in the last 72 hours. C-Reactive Protein No results for input(s): CRP in the last 72 hours.  Microbiology: Pleural fluid culture NG so far Studies/Results: DG Chest 1 View  Result Date: 03/30/2021 CLINICAL DATA:  20 year old male with history of empyema. EXAM: CHEST  1 VIEW COMPARISON:  Chest x-ray 03/29/2021. FINDINGS: Small bore left-sided chest tube remains in position with tip in the medial aspect of the lower left hemithorax. Previously noted left-sided pleural effusion has increased, with worsening areas of atelectasis and/or consolidation in the lower left lung. Right lung is clear. No right pleural effusion. No pneumothorax. No evidence of pulmonary edema. Heart size is normal. Upper mediastinal contours are within normal limits. IMPRESSION: 1. Enlarging left pleural fluid collection compatible with reported empyema. Left-sided chest tube is stable in position. Electronically Signed   By: Trudie Reed M.D.   On: 03/30/2021 05:23   DG Chest [facility] 1 View  Result  Date: 03/31/2021 CLINICAL DATA:  Follow-up empyema EXAM: PORTABLE CHEST 1 VIEW COMPARISON:  03/30/2021 FINDINGS: Cardiac shadow is stable. Right lung is clear. Pigtail catheter is noted in place on the left with significant reduction in pleural fluid when compare with the prior exam. A mild lateral pneumothorax is noted related incomplete re-expansion of the left lung. No bony abnormality is noted. IMPRESSION: Decrease in left-sided pleural fluid with pigtail catheter in place. Incomplete re-expansion of the left lung is noted with a lateral pneumothorax. Electronically Signed   By: Inez Catalina M.D.   On:  03/31/2021 03:18   DG Chest Port 1 View  Result Date: 03/29/2021 CLINICAL DATA:  Dyspnea. EXAM: PORTABLE CHEST 1 VIEW COMPARISON:  4:40 p.m. FINDINGS: Left basilar pigtail chest tube is unchanged in position, likely in the costophrenic angle. Large left pleural effusion is unchanged in size with compressive atelectasis of the left lower lobe. Small rounded foci of gas are nonspecific in the setting of chest tube placement and may represent loculated gas within the pleural space or cavitation in the setting of necrotizing pneumonia. Right lung is clear. No pneumothorax. No pleural effusion on the right. Cardiac size within normal limits. IMPRESSION: Stable left basilar chest tube. Unchanged large left pleural effusion. Interval development of a small foci of gas within the left lung base possibly representing loculated pleural gas or cavitation in the setting of necrotizing pneumonia. Electronically Signed   By: Fidela Salisbury M.D.   On: 03/29/2021 22:10   DG Chest Portable 1 View  Result Date: 03/29/2021 CLINICAL DATA:  Chest tube placement. EXAM: PORTABLE CHEST 1 VIEW COMPARISON:  Chest x-ray from same day at 1223 hours. FINDINGS: New left-sided chest tube. Unchanged loculated large left pleural effusion. No pneumothorax. The right lung is clear. The heart size and mediastinal contours are within normal limits. No acute osseous abnormality. IMPRESSION: 1. New left-sided chest tube with unchanged loculated large left pleural effusion. No pneumothorax. Electronically Signed   By: Titus Dubin M.D.   On: 03/29/2021 16:48     Assessment/Plan: 20 year old male presenting with 2-week history of left-sided chest pain.  Has a history of excess alcohol consumption and has passed out at times and has also vomited under the influence of alcohol.  Left-sided empyema.  Likely secondary to aspiration of gastric contents under influence of alcohol with underlying pneumonia and now with empyema. Is currently on  Vanco cefepime and Flagyl. DC cefepime /flagyl and change to unasyn Because of MRSA nares positive continue vanco TPA instilled into the pleural drain   Leucocytosis improving  44> 22  Anemia due to infection  Hypokalemia   HIV nonreactive Discussed the management with the patient

## 2021-03-31 NOTE — Progress Notes (Signed)
Patient ID: Connor Williams, male   DOB: 08-13-2000, 20 y.o.   MRN: [id number] [facility] PROGRESS NOTE  ROYE GUSTAFSON WIO:[id number] DOB: May 28, 2000 DOA: 03/29/2021 PCP: Pcp, No  HPI/Subjective: Called to see patient because of some hives on his right arm.  Recently had some Toradol infused and recent Maxipime.  Off pressors.  Blood pressure better.  Admitted with chest pain and found to have an empyema.  Patient feels better but still has some pain  Objective: Vitals:   03/31/21 0930 03/31/21 1000  BP: (!) 106/56 114/71  Pulse: (!) 101 99  Resp: 12 19  Temp:    SpO2: 95% 95%    Intake/Output Summary (Last 24 hours) at 03/31/2021 1053 Last data filed at 03/31/2021 1023 Gross per 24 hour  Intake 2512.6 ml  Output 1650 ml  Net 862.6 ml   Filed Weights   03/29/21 0900  Weight: 65.8 kg    ROS: Review of Systems  Respiratory:  Positive for shortness of breath.   Cardiovascular:  Positive for chest pain.  Gastrointestinal:  Negative for abdominal pain, nausea and vomiting.  Exam: Physical Exam HENT:     Head: Normocephalic.     Mouth/Throat:     Pharynx: No oropharyngeal exudate.  Eyes:     General: Lids are normal.     Conjunctiva/sclera: Conjunctivae normal.  Cardiovascular:     Rate and Rhythm: Regular rhythm. Tachycardia present.     Heart sounds: Normal heart sounds, S1 normal and S2 normal.  Pulmonary:     Breath sounds: Examination of the left-lower field reveals decreased breath sounds and rhonchi. Decreased breath sounds and rhonchi present. No wheezing or rales.  Abdominal:     Palpations: Abdomen is soft.     Tenderness: There is no abdominal tenderness.  Musculoskeletal:     Right lower leg: No swelling.     Left lower leg: No swelling.  Skin:    General: Skin is warm.     Comments: Fading bumps on right arm.  Neurological:     Mental Status: He is alert and oriented to person, place, and time.      Scheduled Meds:  acetaminophen  650 mg Oral Q6H  WA   folic acid  1 mg Oral Daily   nicotine  21 mg Transdermal Daily   pantoprazole  40 mg Oral Daily   sodium chloride flush  10 mL Other Q8H   sodium chloride flush  10 mL Other Q8H   thiamine  100 mg Oral Daily   Continuous Infusions:  sodium chloride Stopped (03/31/21 1023)   ceFEPime (MAXIPIME) IV Stopped (03/31/21 0743)   metronidazole Stopped (03/31/21 0053)   norepinephrine (LEVOPHED) Adult infusion Stopped (03/31/21 0215)   vancomycin Stopped (03/31/21 0231)    Assessment/Plan:  Severe sepsis with septic shock, present on admission with left-sided empyema, tachycardia, leukocytosis fever and hypotension.  Patient has a chest tube in.  Critical care team following.  Patient on triple antibiotics with Maxipime, vancomycin and Flagyl.  So far nothing growing out of the cultures.  Off Levophed today.  Will downgrade to medical floor Persistent tachycardia likely with chest tube and infection.  Will not treat. History of alcohol use on thiamine.  No signs of withdrawal Hyponatremia.  Sodium now in the normal range.  Discontinue IV fluids Hypomagnesemia and hypokalemia.  Replace magnesium IV and potassium orally Rash right arm.  Will discontinue Toradol.  Keep an eye on whether Maxipime is causing the rash  Code Status:     Code Status Orders  (From admission, onward)           Start     Ordered   03/29/21 1117  Full code  Continuous        03/29/21 1117           Code Status History     This patient has a current code status but no historical code status.      Family Communication: As per police officer unable to call any family Disposition Plan: Status is: Inpatient  Consultants: Infectious disease Critical care attending  Procedures: Left chest tube placement  Antibiotics: Vancomycin, Maxipime and Flagyl  Time spent: 27 minutes  Tyliah Schlereth [employer]

## 2021-04-01 ENCOUNTER — Inpatient Hospital Stay: Payer: Medicaid Other

## 2021-04-01 DIAGNOSIS — Z789 Other specified health status: Secondary | ICD-10-CM | POA: Diagnosis not present

## 2021-04-01 DIAGNOSIS — D649 Anemia, unspecified: Secondary | ICD-10-CM

## 2021-04-01 DIAGNOSIS — A419 Sepsis, unspecified organism: Secondary | ICD-10-CM | POA: Diagnosis not present

## 2021-04-01 DIAGNOSIS — J189 Pneumonia, unspecified organism: Secondary | ICD-10-CM | POA: Diagnosis not present

## 2021-04-01 DIAGNOSIS — J869 Pyothorax without fistula: Secondary | ICD-10-CM | POA: Diagnosis not present

## 2021-04-01 DIAGNOSIS — R000 Tachycardia, unspecified: Secondary | ICD-10-CM | POA: Diagnosis not present

## 2021-04-01 LAB — BASIC METABOLIC PANEL
Anion gap: 7 (ref 5–15)
BUN: 6 mg/dL (ref 6–20)
CO2: 25 mmol/L (ref 22–32)
Calcium: 7.9 mg/dL — ABNORMAL LOW (ref 8.9–10.3)
Chloride: 105 mmol/L (ref 98–111)
Creatinine, Ser: 0.72 mg/dL (ref 0.61–1.24)
GFR, Estimated: >60 mL/min (ref >60)
Glucose, Bld: 84 mg/dL (ref 70–99)
Potassium: 3.1 mmol/L — ABNORMAL LOW (ref 3.5–5.1)
Sodium: 137 mmol/L (ref 135–145)

## 2021-04-01 LAB — CBC
HCT: 27.7 % — ABNORMAL LOW (ref 39.0–52.0)
Hemoglobin: 9.6 g/dL — ABNORMAL LOW (ref 13.0–17.0)
MCH: 28.5 pg (ref 26.0–34.0)
MCHC: 34.7 g/dL (ref 30.0–36.0)
MCV: 82.2 fL (ref 80.0–100.0)
Platelets: 420 10*3/uL — ABNORMAL HIGH (ref 150–400)
RBC: 3.37 MIL/uL — ABNORMAL LOW (ref 4.22–5.81)
RDW: 13.9 % (ref 11.5–15.5)
WBC: 15.3 10*3/uL — ABNORMAL HIGH (ref 4.0–10.5)
nRBC: 0 % (ref 0.0–0.2)

## 2021-04-01 LAB — TYPE AND SCREEN
ABO/RH(D): O POS
Antibody Screen: NEGATIVE

## 2021-04-01 LAB — CHOLESTEROL, BODY FLUID: Cholesterol, Fluid: 55 mg/dL

## 2021-04-01 LAB — VANCOMYCIN, PEAK: Vancomycin Pk: 41 ug/mL — ABNORMAL HIGH (ref 30–40)

## 2021-04-01 LAB — PROTIME-INR
INR: 1.3 — ABNORMAL HIGH (ref 0.8–1.2)
Prothrombin Time: 15.8 seconds — ABNORMAL HIGH (ref 11.4–15.2)

## 2021-04-01 LAB — PATHOLOGIST SMEAR REVIEW

## 2021-04-01 LAB — PROCALCITONIN: Procalcitonin: 7.1 ng/mL

## 2021-04-01 LAB — FIBRINOGEN: Fibrinogen: 753 mg/dL — ABNORMAL HIGH (ref 210–475)

## 2021-04-01 LAB — VANCOMYCIN, TROUGH: Vancomycin Tr: 7 ug/mL — ABNORMAL LOW (ref 15–20)

## 2021-04-01 MED ORDER — HYDROMORPHONE HCL 1 MG/ML IJ SOLN: 0.5000 mg | INTRAMUSCULAR | Status: DC | PRN

## 2021-04-01 MED ORDER — SODIUM CHLORIDE 0.9 % IV SOLN: INTRAVENOUS | Status: DC | PRN

## 2021-04-01 MED ORDER — ACETAMINOPHEN 325 MG PO TABS
650.0000 mg | ORAL_TABLET | Freq: Four times a day (QID) | ORAL | Status: DC
  Administered 2021-04-01 – 2021-04-02 (×6): 650 mg via ORAL
  Filled 2021-04-01 (×5): qty 2

## 2021-04-01 MED ORDER — CHEWING GUM (ORBIT) SUGAR FREE
1.0000 | CHEWING_GUM | Freq: Four times a day (QID) | ORAL | Status: DC | PRN
  Filled 2021-04-01 (×2): qty 1

## 2021-04-01 MED ORDER — POTASSIUM CHLORIDE CRYS ER 20 MEQ PO TBCR
40.0000 meq | EXTENDED_RELEASE_TABLET | Freq: Once | ORAL | Status: AC
  Administered 2021-04-01: 40 meq via ORAL
  Filled 2021-04-01: qty 2

## 2021-04-01 NOTE — Progress Notes (Signed)
Patient ID: Connor Williams, male   DOB: Jun 02, 2000, 20 y.o.   MRN: [id number] [facility] PROGRESS NOTE  DAMONTA COSSEY PYP:[id number] DOB: Apr 05, 2001 DOA: 03/29/2021 PCP: Pcp, No  HPI/Subjective: Patient feeling better.  Admitted with 2 weeks of chest pain and found to have left-sided pneumonia and empyema.  No recurrence of rash.  Does have a spot in his mouth that was a little bit of a bump.  Objective: Vitals:   04/01/21 0443 04/01/21 0820  BP: 134/74 118/70  Pulse: (!) 126 (!) 101  Resp: 18 18  Temp: (!) 100.4 F (38 C) 98.2 F (36.8 C)  SpO2: 94% 96%    Intake/Output Summary (Last 24 hours) at 04/01/2021 1445 Last data filed at 04/01/2021 1028 Gross per 24 hour  Intake 606.05 ml  Output 1895 ml  Net -1288.95 ml   Filed Weights   03/29/21 0900 03/31/21 1902  Weight: 65.8 kg 76 kg    ROS: Review of Systems  Respiratory:  Positive for shortness of breath.   Cardiovascular:  Positive for chest pain.  Gastrointestinal:  Negative for abdominal pain, nausea and vomiting.  Exam: Physical Exam HENT:     Head: Normocephalic.     Mouth/Throat:     Pharynx: No oropharyngeal exudate.  Eyes:     General: Lids are normal.     Conjunctiva/sclera: Conjunctivae normal.  Cardiovascular:     Rate and Rhythm: Regular rhythm. Tachycardia present.     Heart sounds: Normal heart sounds, S1 normal and S2 normal.  Pulmonary:     Breath sounds: Examination of the left-lower field reveals decreased breath sounds and rhonchi. Decreased breath sounds and rhonchi present. No wheezing or rales.  Abdominal:     Palpations: Abdomen is soft.     Tenderness: There is no abdominal tenderness.  Musculoskeletal:     Right lower leg: No swelling.     Left lower leg: No swelling.  Skin:    General: Skin is warm.     Findings: No rash.  Neurological:     Mental Status: He is alert and oriented to person, place, and time.      Scheduled Meds:  acetaminophen  650 mg Oral Q6H WA    Chlorhexidine Gluconate Cloth  6 each Topical Q0600   folic acid  1 mg Oral Daily   influenza vac split quadrivalent PF  0.5 mL Intramuscular Tomorrow-1000   mupirocin ointment  1 application Nasal BID   nicotine  21 mg Transdermal Daily   pantoprazole  40 mg Oral Daily   potassium chloride  40 mEq Oral Once   sodium chloride flush  10 mL Other Q8H   sodium chloride flush  10 mL Other Q8H   thiamine  100 mg Oral Daily   Continuous Infusions:  sodium chloride Stopped (03/31/21 1023)   ampicillin-sulbactam (UNASYN) IV 3 g (04/01/21 1145)    Assessment/Plan:  Septic shock, present on admission with left-sided empyema, tachycardia, leukocytosis, fever and hypotension.  Initially required Levophed but off Levophed at this point critical care team following the chest tube and they are hopeful that the chest tube will come out potentially tomorrow.  Infectious disease specialist changed antibiotics over to IV Unasyn with Streptococcus intermedius growing out of cultures.  Upon discharge she will need to Augmentin XR 2 g twice a day for 1 week then 875 mg twice a day for another 3 weeks. Persistent tachycardia likely with chest tube and infection. History of alcohol use on thiamine.  No signs of withdrawal Initial hyponatremia.  Last sodium in normal range Hypokalemia and hypomagnesemia.  Replace potassium orally again today and check electrolytes again tomorrow morning Rash right arm resolved Anemia.  Drop in hemoglobin likely from tPA given through the chest tube and some bleeding from the site. Slight inflammation of the duct that drains the parotid gland.  Sucking candies or gum recommended.  Pharmacist going to look for what we have.     Code Status:     Code Status Orders  (From admission, onward)           Start     Ordered   03/29/21 1117  Full code  Continuous        03/29/21 1117           Code Status History     This patient has a current code status but no  historical code status.      Disposition Plan: Status is: Inpatient  Consultants: Critical care team Infectious disease  Procedures: Chest tube placement  Antibiotics: Unasyn  Time spent: 27 minutes, case discussed with both infectious disease and critical care team.  Alford Highland  [facility]

## 2021-04-01 NOTE — Progress Notes (Signed)
Date of Admission:  03/29/2021      ID: GEORGIOS KINA is a 20 y.o. male  Principal Problem:   CAP (community acquired pneumonia) Active Problems:   Pleural effusion on left   Hyponatremia   Septic shock (HCC)   Tobacco abuse   Asthma   Alcohol use   Empyema (HCC)   Sepsis (HCC)    Subjective: Feeling better Left chest pain better No fever Some stiffness left leg  Medications:   acetaminophen  650 mg Oral Q6H WA   Chlorhexidine Gluconate Cloth  6 each Topical Q0600   folic acid  1 mg Oral Daily   influenza vac split quadrivalent PF  0.5 mL Intramuscular Tomorrow-1000   mupirocin ointment  1 application Nasal BID   nicotine  21 mg Transdermal Daily   pantoprazole  40 mg Oral Daily   sodium chloride flush  10 mL Other Q8H   sodium chloride flush  10 mL Other Q8H   thiamine  100 mg Oral Daily    Objective: Vital signs in last 24 hours: Temp:  [98.2 F (36.8 C)-100.4 F (38 C)] 98.2 F (36.8 C) (12/09 0820) Pulse Rate:  [99-126] 101 (12/09 0820) Resp:  [14-25] 18 (12/09 0820) BP: (112-152)/(62-87) 118/70 (12/09 0820) SpO2:  [94 %-99 %] 96 % (12/09 0820) Weight:  [76 kg] 76 kg (12/08 1902)  PHYSICAL EXAM:  General: Alert, cooperative, no distress, appears stated age.  Head: Normocephalic, without obvious abnormality, atraumatic. Eyes: Conjunctivae clear, anicteric sclerae. Pupils are equal ENT Nares normal. No drainage or sinus tenderness. Lips, mucosa, and tongue normal. No Thrush Neck: Supple, symmetrical, no adenopathy, thyroid: non tender no carotid bruit and no JVD. Back: No CVA tenderness. Lungsb/l air entry- decreased left side Pleural tube draining blood stained fluid. Heart: Tachycardia Abdomen: Soft, non-tender,not distended. Bowel sounds normal. No masses Extremities: atraumatic, no cyanosis. No edema. No clubbing, scar over left knee Skin: No rashes or lesions. Or bruising Lymph: Cervical, supraclavicular normal. Neurologic: Grossly  non-focal  Lab Results Recent Labs    03/31/21 0442 03/31/21 2328 04/01/21 0254  WBC 22.4* 15.7* 15.3*  HGB 10.2* 10.6* 9.6*  HCT 29.8* 30.6* 27.7*  NA 138  --  137  K 3.2*  --  3.1*  CL 106  --  105  CO2 24  --  25  BUN 7  --  6  CREATININE 0.60*  --  0.72   Liver Panel Recent Labs    03/29/21 1747  ALBUMIN 2.3*  Microbiology: Pleural fluid culture - streptococcus intermedius Studies/Results: CT CHEST WO CONTRAST  Result Date: 04/01/2021 CLINICAL DATA:  Status post left-sided chest tube placement for empyema. Increased bloody output from chest tube after thrombolytic injection into chest tube. EXAM: CT CHEST WITHOUT CONTRAST TECHNIQUE: Multidetector CT imaging of the chest was performed following the standard protocol without IV contrast. COMPARISON:  CT of the chest on 03/29/2021 FINDINGS: Cardiovascular: Normal heart size. No pericardial fluid. Normal caliber of thoracic aorta and central pulmonary arteries. Mediastinum/Nodes: No evidence enlarged mediastinal or hilar lymph nodes. No abnormal mediastinal masses or evidence of mediastinitis. 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 Lungs/Pleura: Left basilar lower lobe consolidation is consistent with pneumonia. There are some patchy areas probable necrosis at the inferior left lung base. Posterior left-sided chest tube appears well positioned with substantial reduction in size of the left-sided empyema compared to the pre-drainage CT with small amounts of fluid and air remaining in the pleural space. No significant component  of pneumothorax outside of the empyema space. Interval development of a trace right pleural effusion with associated right basilar atelectasis since the prior CT. Upper Abdomen: No acute abnormality. Musculoskeletal: No chest wall mass or suspicious bone lesions identified. IMPRESSION: 1. Substantial reduction in left empyema after chest tube drainage with well-positioned pigtail chest tube in the  posterior left pleural space. There remains a small amount of fluid and air in the left pleural space. 2. Left basilar pneumonia which is likely partially necrotic. 3. New trace right pleural effusion with associated right basilar atelectasis. Electronically Signed   By: Irish Lack M.D.   On: 04/01/2021 09:32   DG Chest Port 1 View  Result Date: 04/01/2021 CLINICAL DATA:  20 year old male with empyema status post chest tube placement on 03/29/2021. EXAM: PORTABLE CHEST 1 VIEW COMPARISON:  03/31/2021 and earlier. FINDINGS: Portable AP upright view at 0354 hours. Pigtail type left chest tube remains in place, configuration stable from yesterday. Residual peripheral and basilar pleural density. But resolved small left pneumothorax since yesterday. Left lung base opacity continues to obscure portion of the left hemidiaphragm. Stable streaky left perihilar opacity. Stable cardiac size and mediastinal contours. Visualized tracheal air column is within normal limits. Negative right lung. No acute osseous abnormality identified. Negative visible bowel gas. IMPRESSION: 1. Stable left chest tube. Resolved small left pneumothorax. 2. Residual left pleural thickening and/or effusion with patchy left lung opacity. 3. No new cardiopulmonary abnormality. Electronically Signed   By: Odessa Fleming M.D.   On: 04/01/2021 04:14   DG Chest Port 1 View  Result Date: 03/31/2021 CLINICAL DATA:  Follow-up empyema EXAM: PORTABLE CHEST 1 VIEW COMPARISON:  03/30/2021 FINDINGS: Cardiac shadow is stable. Right lung is clear. Pigtail catheter is noted in place on the left with significant reduction in pleural fluid when compare with the prior exam. A mild lateral pneumothorax is noted related incomplete re-expansion of the left lung. No bony abnormality is noted. IMPRESSION: Decrease in left-sided pleural fluid with pigtail catheter in place. Incomplete re-expansion of the left lung is noted with a lateral pneumothorax. Electronically  Signed   By: Alcide Clever M.D.   On: 03/31/2021 03:18     Assessment/Plan: 20 year old male presenting with 2-week history of left-sided chest pain.  Has a history of excess alcohol consumption and has passed out at times and has also vomited under the influence of alcohol.  Left-sided empyema.  Likely secondary to aspiration of gastric contents under influence of alcohol with underlying pneumonia and now with empyema. S/p pleural cath Strep intermedius in pleural fluid Currently on unasyn and vanco- DC vanco TPA instilled into the pleural drain  Leucocytosis improving  44> 15 When he is ready to be discharged he could go on Augmentin XR 2 gram PO BID for 7 days followed by Augmentin 875mg  PO BID.x 3 weeks  Need to make sure that he can get Augmentin XR 1 gram from outside pharmacy  as not all pharmacies carry it  Anemia due to infection  Hypokalemia   HIV nonreactive Discussed the management with the patient  And hospitalist ID will follow him peripherally this weekend- call if needed Follow up as OP

## 2021-04-01 NOTE — Progress Notes (Signed)
NAME:  Connor Williams, MRN:  [id number], DOB:  2000-11-10, LOS: 3 ADMISSION DATE:  03/29/2021, CONSULTATION DATE:  03/29/2021 REFERRING MD:  Dr. Clyde Lundborg, CHIEF COMPLAINT:  Chest pain and Shortness of Breath   Brief Pt Description / Synopsis:  20 y.o. Male admitted with Sepsis due to Community Acquired Pneumonia with large Left sided partially loculated pleural effusion concerning for Empyema.  PCCM consulted for chest tube placement. ID following.  May require transfer to [facility] for CT Surgery and Decortication.  History of Present Illness:  Connor Williams is a 20 year old male with a past medical history significant for asthma, tobacco abuse, and alcohol use who presented to [facility] ED on 03/29/2021 due to complaints of chest pain and shortness of breath.    Patient has been incarcerated for the past 3 weeks, and reports he developed chest pain approximately 2 weeks ago.  The chest pain is described as located in the left side, persistent, sharp, pleuritic, and aggravated by deep breaths.  He also reported associated shortness of breath and mild intermittent nonproductive cough.  He reports that approximately 2 days ago he woke up drenched in sweat.  He denies dizziness, palpitations, abdominal pain, nausea, vomiting, diarrhea, dysuria.  He denies IV drug use, does report drinking 6 beers every day prior to going to jail.  ED Course: Initial vital signs: Temperature 99.3 orally, respiratory rate 36, pulse 128, blood pressure 119/69, SPO2 98% on room air Significant Labs: Sodium 128, WBC 44.8 with neutrophilia, hemoglobin 12.3, hematocrit 35.3, platelets 461 serum acetaminophen and less than 10, salicylates less than 7, lactic acid 1.8 SARS-CoV-2 and influenza PCR negative Urine drug screen is negative Imaging: Chest x-ray>>Large left pleural effusion with opacified or obscured underlying lung. Suggest CT with contrast.. CT chest>>Large partially loculated left pleural effusion of unknown etiology.  Compressive atelectasis versus pneumonia in the left lower lobe and lingula. Medications given: 2.5 L of LR boluses, ceftriaxone x1 dose, cefepime, Flagyl, vancomycin  Thoracentesis was performed by IR which yielded 150 cc of yellow colored fluid (effusion noted to be severely loculated and therefore unable to drain fully).  Preliminary pleural fluid analysis is consistent for exudate.  PCCM was consulted for chest tube placement.  CT surgery at [facility] has been contacted, patient may need transfer to [facility] for decortication.  Pertinent  Medical History  Asthma Tobacco Abuse Alcohol use  Micro Data:  03/29/2021: SARS-CoV-2 and influenza PCR>> negative 03/29/2021: Blood culture x2>> no growth to date 03/29/2021: Pleural fluid>>STREPTOCOCCUS INTERMEDIUS  03/29/2021: Acid-fast smear pleural fluid>>negative 03/29/2021: Acid-fast culture from pleural fluid>> 03/29/2021: Strep pneumo urinary antigen>>negative 03/29/2021: Legionella urinary antigen>>negative  Antimicrobials:  Ceftriaxone 12/6 x1 dose Cefepime 12/6>>12/8 Flagyl 12/6>>12/8 Vancomycin 12/6>> Unasyn 12/8>>  Significant Hospital Events: Including procedures, antibiotic start and stop dates in addition to other pertinent events   03/29/21: Presented to ED with SOB and chest pain.  Found to have Pneumonia with large left partially pleural effusion suspicious for Empyema. Thoracentesis performed by IR. PCCM consulted for chest tube placement.  ID consulted 03/30/21: Requiring low dose pressors (Levophed 3 mcg), will give additional 1L LR bolus and assess fluid response.  Instillation of tPA/Dornase into chest tube, plan to repeat this evening 03/31/21: Weaned off vasopressors.  Instillation of tPA/Dornase into chest tube this morning, plans to repeat this evening 04/01/21: Follow CT Chest with much in improvement left empyema, shows left basilar pneumonia which is likely necrotic.  Chest tube placed to water seal. Pleural fluid culture  with  Streptococcus Intermedius  Interim History / Subjective:  -No significant events noted overnight -Afebrile, hemodynamically stable, WEANED OFF VASOPRESSORS -This morning resting comfortably, denies chest pain or SOB, on room air -CT Chest this morning:  much in improvement left empyema, shows left basilar pneumonia which is likely necrotic.  -Chest tube placed to WATER SEAL -Pleural fluid culture with  STREPTOCOCCUS INTERMEDIUS -ABX changed yesterday to Unasyn, Vancomycin continued per ID  Objective   Blood pressure 118/70, pulse (!) 101, temperature 98.2 F (36.8 C), resp. rate 18, height 5\' 5"  (1.651 m), weight 76 kg, SpO2 96 %.        Intake/Output Summary (Last 24 hours) at 04/01/2021 1054 Last data filed at 04/01/2021 1028 Gross per 24 hour  Intake 801.5 ml  Output 2595 ml  Net -1793.5 ml    Filed Weights   03/29/21 0900 03/31/21 1902  Weight: 65.8 kg 76 kg    Examination: General: Acutely ill-appearing male, laying in bed, on room air, no acute distress HENT: Atraumatic, normocephalic, neck supple, no JVD Lungs: Clear breath sounds to the right and left upper zone, diminished breath sounds to left lower zone, even, nonlabored Cardiovascular: Tachycardia, regular rhythm (sinus tachycardia on telemetry), no murmurs, rubs, gallops, 2+ distal pulses Abdomen: Soft, nontender, nondistended, no guarding rebound tenderness, bowel sounds positive x4 Extremities: No deformities, no edema, normal bulk and tone Neuro: Awake and alert, oriented x4, follows commands, no focal deficits, speech clear, pupils PERRLA GU: Deferred  Resolved Hospital Problem list     Assessment & Plan:   Septic Shock ~ RESOLVED -Continuous cardiac monitoring -Maintain MAP >65 -IV fluids  -Vasopressors weaned off -Lactic acid normalized (1.1 ~ 1.1) -HS Troponin negative x2 (9 ~ 9)  Severe Sepsis in the setting of Pneumonia with Large Left partially loculated Pleural Effusion concerning for  EMPYEMA (Meets SIRS Criteria: WBC 44.8, HR 130's, RR 37) -Monitor fever curve -Trend WBC's & Procalcitonin -Follow cultures as above -ID following, appreciate input -Continue Unasyn & Vancomycin pending cultures & sensitivities ~ will defer to ID -Chest tube placement by Dr. Tacy Learn 12/6 -s/p Instillation of tPA/Dornase into chest tube x4 doses -CT Chest 12/9: 1. Substantial reduction in left empyema after chest tube drainage with well-positioned pigtail chest tube in the posterior left pleural space. There remains a small amount of fluid and air in the left pleural space. 2. Left basilar pneumonia which is likely partially necrotic. 3. New trace right pleural effusion with associated right basilar atelectasis. -Chest tube placed to WATER SEAL 12/9 (hopeful to be able to d/c chest tube on 12/10) -Will need long course of ABX, with Pulmonary & ID outpatient follow up    Best Practice (right click and "Reselect all SmartList Selections" daily)   Diet/type: Regular consistency (see orders) DVT prophylaxis: SCD GI prophylaxis: N/A Lines: N/A Foley:  N/A Code Status:  full code Last date of multidisciplinary goals of care discussion [N/A]  Labs   CBC: Recent Labs  Lab 03/29/21 0825 03/30/21 0703 03/31/21 0442 03/31/21 2328 04/01/21 0254  WBC 44.8* 30.5*  31.8* 22.4* 15.7* 15.3*  NEUTROABS 39.2*  --   --   --   --   HGB 12.2* 10.3*  10.1* 10.2* 10.6* 9.6*  HCT 35.8* 29.6*  29.2* 29.8* 30.6* 27.7*  MCV 85.2 84.3  83.7 85.4 83.8 82.2  PLT 485* 417*  421* 407* 460* 420*     Basic Metabolic Panel: Recent Labs  Lab 03/29/21 0415 03/29/21 1747 03/31/21 0442 04/01/21 0254  NA  128* 134* 138 137  K 3.8 3.3* 3.2* 3.1*  CL 93* 102 106 105  CO2 26 25 24 25   GLUCOSE 103* 119* 73 84  BUN 13 10 7 6   CREATININE 1.01 0.95 0.60* 0.72  CALCIUM 8.5* 7.8* 7.9* 7.9*  MG  --   --  1.6*  --     GFR: Estimated Creatinine Clearance: 139 mL/min (by C-G formula based on SCr of 0.72  mg/dL). Recent Labs  Lab 03/29/21 1003 03/29/21 1146 03/29/21 1747 03/29/21 2127 03/30/21 0703 03/30/21 0705 03/31/21 0442 03/31/21 2328 04/01/21 0254  PROCALCITON  --   --  22.48  --   --  18.30 12.31  --  7.10  WBC  --   --   --   --  30.5*  31.8*  --  22.4* 15.7* 15.3*  LATICACIDVEN 1.8 1.8  --  1.1 1.1  --   --   --   --      Liver Function Tests: Recent Labs  Lab 03/29/21 1747  ALBUMIN 2.3*    No results for input(s): LIPASE, AMYLASE in the last 168 hours. No results for input(s): AMMONIA in the last 168 hours.  ABG    Component Value Date/Time   PHART 7.29 (L) 09/23/2016 0937   PCO2ART 57 (H) 09/23/2016 0937   PO2ART 182 (H) 09/23/2016 0937   HCO3 27.4 09/23/2016 0937   ACIDBASEDEF 0.6 09/23/2016 0937   O2SAT 99.5 09/23/2016 0937      Coagulation Profile: Recent Labs  Lab 03/29/21 1747 03/31/21 2328  INR 1.8* 1.3*     Cardiac Enzymes: No results for input(s): CKTOTAL, CKMB, CKMBINDEX, TROPONINI in the last 168 hours.  HbA1C: No results found for: HGBA1C  CBG: Recent Labs  Lab 03/29/21 1336  GLUCAP 86     Review of Systems:   Positives in BOLD: Pt currently denies all complaints Gen: Denies fever, chills, weight change, fatigue, night sweats HEENT: Denies blurred vision, double vision, hearing loss, tinnitus, sinus congestion, rhinorrhea, sore throat, neck stiffness, dysphagia PULM: Denies shortness of breath, cough, sputum production, hemoptysis, wheezing CV: Denies chest pain, edema, orthopnea, paroxysmal nocturnal dyspnea, palpitations GI: Denies abdominal pain, nausea, vomiting, diarrhea, hematochezia, melena, constipation, change in bowel habits GU: Denies dysuria, hematuria, polyuria, oliguria, urethral discharge Endocrine: Denies hot or cold intolerance, polyuria, polyphagia or appetite change Derm: Denies rash, dry skin, scaling or peeling skin change Heme: Denies easy bruising, bleeding, bleeding gums Neuro: Denies headache,  numbness, weakness, slurred speech, loss of memory or consciousness   Past Medical History:  He,  has a past medical history of Alcohol use, Asthma, and Tobacco abuse.   Surgical History:  History reviewed. No pertinent surgical history.   Social History:   reports that he has been smoking cigarettes. He has a 3.00 pack-year smoking history. He has never used smokeless tobacco. He reports current alcohol use. He reports current drug use. Drug: Marijuana.   Family History:  His family history includes Alcohol abuse in his father; Hypertension in his mother; Hypothyroidism in his mother.   Allergies Allergies  Allergen Reactions   Toradol [Ketorolac Tromethamine] Hives     Home Medications  Prior to Admission medications   Not on File     Care time: 38 minutes     Darel Hong, AGACNP-BC [facility] Prefer epic messenger for cross cover needs If after hours, please call E-link

## 2021-04-02 ENCOUNTER — Inpatient Hospital Stay: Payer: Medicaid Other

## 2021-04-02 DIAGNOSIS — Z789 Other specified health status: Secondary | ICD-10-CM | POA: Diagnosis not present

## 2021-04-02 DIAGNOSIS — A419 Sepsis, unspecified organism: Secondary | ICD-10-CM | POA: Diagnosis not present

## 2021-04-02 DIAGNOSIS — D649 Anemia, unspecified: Secondary | ICD-10-CM | POA: Diagnosis not present

## 2021-04-02 DIAGNOSIS — J869 Pyothorax without fistula: Secondary | ICD-10-CM | POA: Diagnosis not present

## 2021-04-02 LAB — BODY FLUID CULTURE W GRAM STAIN

## 2021-04-02 LAB — CBC
HCT: 28.3 % — ABNORMAL LOW (ref 39.0–52.0)
Hemoglobin: 10 g/dL — ABNORMAL LOW (ref 13.0–17.0)
MCH: 29.3 pg (ref 26.0–34.0)
MCHC: 35.3 g/dL (ref 30.0–36.0)
MCV: 83 fL (ref 80.0–100.0)
Platelets: 449 10*3/uL — ABNORMAL HIGH (ref 150–400)
RBC: 3.41 MIL/uL — ABNORMAL LOW (ref 4.22–5.81)
RDW: 14 % (ref 11.5–15.5)
WBC: 11.8 10*3/uL — ABNORMAL HIGH (ref 4.0–10.5)
nRBC: 0 % (ref 0.0–0.2)

## 2021-04-02 LAB — BASIC METABOLIC PANEL
Anion gap: 7 (ref 5–15)
BUN: <5 mg/dL — ABNORMAL LOW (ref 6–20)
CO2: 28 mmol/L (ref 22–32)
Calcium: 8.2 mg/dL — ABNORMAL LOW (ref 8.9–10.3)
Chloride: 105 mmol/L (ref 98–111)
Creatinine, Ser: 0.79 mg/dL (ref 0.61–1.24)
GFR, Estimated: >60 mL/min (ref >60)
Glucose, Bld: 79 mg/dL (ref 70–99)
Potassium: 4.2 mmol/L (ref 3.5–5.1)
Sodium: 140 mmol/L (ref 135–145)

## 2021-04-02 LAB — PHOSPHORUS: Phosphorus: 4.1 mg/dL (ref 2.5–4.6)

## 2021-04-02 LAB — MAGNESIUM: Magnesium: 1.8 mg/dL (ref 1.7–2.4)

## 2021-04-02 MED ORDER — FENTANYL CITRATE PF 50 MCG/ML IJ SOSY
PREFILLED_SYRINGE | INTRAMUSCULAR | Status: AC
  Administered 2021-04-02: 50 ug via INTRAVENOUS
  Filled 2021-04-02: qty 1

## 2021-04-02 MED ORDER — ACETAMINOPHEN 325 MG PO TABS: 650.0000 mg | ORAL_TABLET | Freq: Four times a day (QID) | ORAL | Status: AC

## 2021-04-02 MED ORDER — POLYETHYLENE GLYCOL 3350 17 G PO PACK: 17.0000 g | PACK | Freq: Every day | ORAL | 0 refills | Status: AC | PRN

## 2021-04-02 MED ORDER — MAGNESIUM OXIDE -MG SUPPLEMENT 400 (240 MG) MG PO TABS: 400.0000 mg | ORAL_TABLET | Freq: Every day | ORAL | 0 refills | Status: AC

## 2021-04-02 MED ORDER — AMOXICILLIN-POT CLAVULANATE ER 1000-62.5 MG PO TB12: 2.0000 | ORAL_TABLET | Freq: Two times a day (BID) | ORAL | 0 refills | Status: AC

## 2021-04-02 MED ORDER — FLUTICASONE PROPIONATE 50 MCG/ACT NA SUSP
1.0000 | Freq: Every day | NASAL | Status: DC
Start: 2021-04-02 — End: 2021-04-02
  Filled 2021-04-02: qty 16

## 2021-04-02 MED ORDER — COVID-19 MRNA VAC-TRIS(PFIZER) 30 MCG/0.3ML IM SUSP
0.3000 mL | Freq: Once | INTRAMUSCULAR | Status: AC
  Administered 2021-04-02: 0.3 mL via INTRAMUSCULAR
  Filled 2021-04-02: qty 0.3

## 2021-04-02 MED ORDER — AMOXICILLIN-POT CLAVULANATE 875-125 MG PO TABS: 1.0000 | ORAL_TABLET | Freq: Two times a day (BID) | ORAL | 0 refills | Status: AC

## 2021-04-02 MED ORDER — FLUTICASONE PROPIONATE 50 MCG/ACT NA SUSP: 1.0000 | Freq: Every day | NASAL | 0 refills | Status: AC

## 2021-04-02 MED ORDER — IBUPROFEN 600 MG PO TABS: 600.0000 mg | ORAL_TABLET | Freq: Three times a day (TID) | ORAL | 0 refills | Status: AC | PRN

## 2021-04-02 MED ORDER — MUPIROCIN 2 % EX OINT: 1.0000 "application " | TOPICAL_OINTMENT | Freq: Two times a day (BID) | CUTANEOUS | 0 refills | Status: AC

## 2021-04-02 MED ORDER — FENTANYL CITRATE PF 50 MCG/ML IJ SOSY: 50.0000 ug | PREFILLED_SYRINGE | Freq: Once | INTRAMUSCULAR | Status: AC

## 2021-04-02 MED ORDER — MAGNESIUM OXIDE -MG SUPPLEMENT 400 (240 MG) MG PO TABS
400.0000 mg | ORAL_TABLET | Freq: Every day | ORAL | Status: DC
  Administered 2021-04-02: 400 mg via ORAL
  Filled 2021-04-02: qty 1

## 2021-04-02 MED ORDER — COVID-19MRNA BIVAL VACC PFIZER 30 MCG/0.3ML IM SUSP
0.3000 mL | Freq: Once | INTRAMUSCULAR | Status: DC
  Filled 2021-04-02: qty 0.3

## 2021-04-02 MED ORDER — AMOXICILLIN-POT CLAVULANATE ER 1000-62.5 MG PO TB12
2.0000 | ORAL_TABLET | ORAL | Status: AC
  Administered 2021-04-02: 2 via ORAL
  Filled 2021-04-02 (×2): qty 2

## 2021-04-02 MED ORDER — POLYETHYLENE GLYCOL 3350 17 G PO PACK
17.0000 g | PACK | Freq: Every day | ORAL | Status: DC
  Administered 2021-04-02: 17 g via ORAL
  Filled 2021-04-02 (×2): qty 1

## 2021-04-02 NOTE — Progress Notes (Signed)
Chest Tube Removal  Procedure Note  Connor Williams  [id number]  2000/12/30   Date:04/02/21  Time:10:05 am     Provider Performing: Webb Silversmith  Brief Patient Description: 20 year old male admitted with Left-sided empyema secondary to aspiration of gastric contents under influence of alcohol with underlying pneumonia now s/p placement of a 14 French pigtail pleural catheter was placed into the left pleural space.  Indication for removal: Chest xray this morning shows Mild loculated left pleural effusion and left lower lobe airspace disease unchanged. No pneumothorax. Drainage volume less than 200 ml in the last 24 hours period.The fluid is serous and the patient;'s clinical status has improved.  Procedure Description: Patient was informed of the procedure and verbalized understanding and rationale for removal. Patient premedicated with 50 mcg of IV Fentanyl for comfort per patient's request. Chest tube was clamped and drain set off to patient.  A stack of xeroform petroleum gauze dressing om a non adhesive dressing was used. While keeping the fourth piece of tape ready, sutures were removed. Gentle pressure was applied on the dressing with one hand while swiftly pulling out the 14 French pigtail pleural catheter from the left pleural space while patient taking a deep breath.  Complications/Tolerance: None; patient tolerated the procedure well     Webb Silversmith, DNP, CCRN, FNP-C, AGACNP-BC Acute Care Nurse Practitioner  [facility] Pager: [phone] [place] [facility]

## 2021-04-02 NOTE — Discharge Instructions (Addendum)
Needs antibiotic for 4 weeks upon discharge.  High dos augmentin xr 2gm twice a day for seven days then augmentin 875mg  po twice a day for 3 weeks after.

## 2021-04-02 NOTE — Discharge Summary (Signed)
[facility] [place] at [facility]   PATIENT NAME: Connor Williams    MR#:  [id number]  DATE OF BIRTH:  07/06/2000  DATE OF ADMISSION:  03/29/2021 ADMITTING PHYSICIAN: Alford Highland, MD  DATE OF DISCHARGE: 04/02/2021  3:06 PM  PRIMARY CARE PHYSICIAN: Pcp, No    ADMISSION DIAGNOSIS:  Hyponatremia [E87.1] SOB (shortness of breath) [R06.02] Pleural effusion [J90] CAP (community acquired pneumonia) [J18.9] Status post thoracentesis [Z98.890] Empyema (HCC) [J86.9] Sepsis (HCC) [A41.9] CAP (community acquired pneumonia) due to Escherichia coli (HCC) [J15.5] Sepsis, due to unspecified organism, unspecified whether acute organ dysfunction present (HCC) [A41.9]  DISCHARGE DIAGNOSIS:  Principal Problem:   CAP (community acquired pneumonia) Active Problems:   Pleural effusion on left   Hyponatremia   Septic shock (HCC)   Tobacco abuse   Asthma   Alcohol use   Empyema (HCC)   Sepsis (HCC)   Anemia   SECONDARY DIAGNOSIS:   Past Medical History:  Diagnosis Date  . Alcohol use   . Asthma   . Tobacco abuse     HOSPITAL COURSE:   Septic shock, present on admission with left-sided empyema, tachycardia, leukocytosis, fever and hypotension.  Initially the patient required Levophed.  Critical care team placed the chest tube and removed the chest tube on 04/02/2021.  Pulmonary believe that the patient was doing better and did not need any VATS procedure at this point.  I recommended repeat imaging with a CT scan as outpatient.  We will have the patient follow-up with infectious disease in 3 weeks.  Streptococcus intermedius was growing out of cultures.  As per infectious disease will be on Augmentin XR 2 g twice a day for 1 week then 875 mg twice a day for another 3 weeks after that.  White blood cell count of 44.8 on presentation and down to 11.8 upon discharge. Persistent tachycardia.  Heart rate improved with treatment of infection. Previous alcohol use.  No signs of  withdrawal during the hospital course Initial hyponatremia.  Last sodium 140. Hypokalemia and hypomagnesemia replaced during the hospital course.  We will give oral magnesium supplementation upon disposition. Rash right arm has resolved Anemia with drop in hemoglobin likely from lytics for chest tube and some bleeding from the site.  Last hemoglobin stable at 10. Patient requested COVID vaccination while here in the hospital which was given by nursing staff.  DISCHARGE CONDITIONS:   Satisfactory  CONSULTS OBTAINED:  Infectious disease Pulmonary  DRUG ALLERGIES:   Allergies  Allergen Reactions  . Toradol [Ketorolac Tromethamine] Hives    DISCHARGE MEDICATIONS:   Allergies as of 04/02/2021       Reactions   Toradol [ketorolac Tromethamine] Hives        Medication List     TAKE these medications    acetaminophen 325 MG tablet Commonly known as: TYLENOL Take 2 tablets (650 mg total) by mouth every 6 (six) hours.   amoxicillin-clavulanate 1000-62.5 MG 12 hr tablet Commonly known as: Augmentin XR Take 2 tablets by mouth 2 (two) times daily for 7 days.   amoxicillin-clavulanate 875-125 MG tablet Commonly known as: Augmentin Take 1 tablet by mouth 2 (two) times daily for 21 days. Start taking on: April 09, 2021 Notes to patient: (duplicate order)   fluticasone 50 MCG/ACT nasal spray Commonly known as: Flonase Place 1 spray into both nostrils daily.   ibuprofen 600 MG tablet Commonly known as: ADVIL Take 1 tablet (600 mg total) by mouth every 8 (eight) hours as needed for moderate  pain.   magnesium oxide 400 (240 Mg) MG tablet Commonly known as: MAG-OX Take 1 tablet (400 mg total) by mouth daily.   mupirocin ointment 2 % Commonly known as: BACTROBAN Place 1 application into the nose 2 (two) times daily for 5 days.   polyethylene glycol 17 g packet Commonly known as: MIRALAX / GLYCOLAX Take 17 g by mouth daily as needed for severe constipation.          DISCHARGE INSTRUCTIONS:   Must take antibiotics for 4 weeks upon discharge [facility] Need follow-up appointment with infectious disease in 3 weeks.  If you experience worsening of your admission symptoms, develop shortness of breath, life threatening emergency, suicidal or homicidal thoughts you must seek medical attention immediately by calling 911 or calling your MD immediately  if symptoms less severe.  You Must read complete instructions/literature along with all the possible adverse reactions/side effects for all the Medicines you take and that have been prescribed to you. Take any new Medicines after you have completely understood and accept all the possible adverse reactions/side effects.   Please note  You were cared for by a hospitalist during your hospital stay. If you have any questions about your discharge medications or the care you received while you were in the hospital after you are discharged, you can call the unit and asked to speak with the hospitalist on call if the hospitalist that took care of you is not available. Once you are discharged, your primary care physician will handle any further medical issues. Please note that NO REFILLS for any discharge medications will be authorized once you are discharged, as it is imperative that you return to your primary care physician (or establish a relationship with a primary care physician if you do not have one) for your aftercare needs so that they can reassess your need for medications and monitor your lab values.    Today   CHIEF COMPLAINT:   Chief Complaint  Patient presents with  . Chest Pain    HISTORY OF PRESENT ILLNESS:  Connor Williams  is a 20 y.o. male came in with chest pain and found to have empyema   VITAL SIGNS:  Blood pressure (!) 135/93, pulse 92, temperature 98.4 F (36.9 C), temperature source Oral, resp. rate 18, height 5\' 5"  (1.651 m), weight 76 kg, SpO2  98 %.  I/O:   Intake/Output Summary (Last 24 hours) at 04/02/2021 1712 Last data filed at 04/02/2021 1410 Gross per 24 hour  Intake 1184.34 ml  Output --  Net 1184.34 ml    PHYSICAL EXAMINATION:  GENERAL:  20 y.o.-year-old patient lying in the bed with no acute distress.  EYES: Pupils equal, round, reactive to light and accommodation. No scleral icterus.  HEENT: Head atraumatic, normocephalic. Oropharynx and nasopharynx clear.   LUNGS: Decreased breath sounds left base.  Rhonchi left base. No use of accessory muscles of respiration.  CARDIOVASCULAR: S1, S2 normal. No murmurs, rubs, or gallops.  ABDOMEN: Soft, non-tender, non-distended.  EXTREMITIES: No pedal edema.  NEUROLOGIC: Cranial nerves II through XII are intact. Muscle strength 5/5 in all extremities. Sensation intact. Gait not checked.  PSYCHIATRIC: The patient is alert and oriented x 3.  SKIN: No obvious rash, lesion, or ulcer.   DATA REVIEW:   CBC Recent Labs  Lab 04/02/21 0624  WBC 11.8*  HGB 10.0*  HCT 28.3*  PLT 449*    Chemistries  Recent Labs  Lab 04/02/21 [phone]  NA 140  K 4.2  CL 105  CO2 28  GLUCOSE 79  BUN <5*  CREATININE 0.79  CALCIUM 8.2*  MG 1.8     Microbiology Results  Results for orders placed or performed during the hospital encounter of 03/29/21  Resp Panel by RT-PCR (Flu A&B, Covid) Nasopharyngeal Swab     Status: None   Collection Time: 03/29/21  4:47 AM   Specimen: Nasopharyngeal Swab; Nasopharyngeal(NP) swabs in vial transport medium  Result Value Ref Range Status   SARS Coronavirus 2 by RT PCR NEGATIVE NEGATIVE Final    Comment: (NOTE) SARS-CoV-2 target nucleic acids are NOT DETECTED.  The SARS-CoV-2 RNA is generally detectable in upper respiratory specimens during the acute phase of infection. The lowest concentration of SARS-CoV-2 viral copies this assay can detect is 138 copies/mL. A negative result does not preclude SARS-Cov-2 infection and should not be used as the  sole basis for treatment or other patient management decisions. A negative result may occur with  improper specimen collection/handling, submission of specimen other than nasopharyngeal swab, presence of viral mutation(s) within the areas targeted by this assay, and inadequate number of viral copies(<138 copies/mL). A negative result must be combined with clinical observations, patient history, and epidemiological information. The expected result is Negative.  Fact Sheet for Patients:  BloggerCourse.com  Fact Sheet for Healthcare Providers:  SeriousBroker.it  This test is no t yet approved or cleared by the [place] FDA and  has been authorized for detection and/or diagnosis of SARS-CoV-2 by FDA under an Emergency Use Authorization (EUA). This EUA will remain  in effect (meaning this test can be used) for the duration of the COVID-19 declaration under Section 564(b)(1) of the Act, 21 U.S.C.section 360bbb-3(b)(1), unless the authorization is terminated  or revoked sooner.       Influenza A by PCR NEGATIVE NEGATIVE Final   Influenza B by PCR NEGATIVE NEGATIVE Final    Comment: (NOTE) The Xpert Xpress SARS-CoV-2/FLU/RSV plus assay is intended as an aid in the diagnosis of influenza from Nasopharyngeal swab specimens and should not be used as a sole basis for treatment. Nasal washings and aspirates are unacceptable for Xpert Xpress SARS-CoV-2/FLU/RSV testing.  Fact Sheet for Patients: BloggerCourse.com  Fact Sheet for Healthcare Providers: SeriousBroker.it  This test is not yet approved or cleared by the [place] FDA and has been authorized for detection and/or diagnosis of SARS-CoV-2 by FDA under an Emergency Use Authorization (EUA). This EUA will remain in effect (meaning this test can be used) for the duration of the COVID-19 declaration under Section 564(b)(1) of the  Act, 21 U.S.C. section 360bbb-3(b)(1), unless the authorization is terminated or revoked.  Performed at [facility], [address] [place], [place] [address]   Blood culture (routine x 2)     Status: None (Preliminary result)   Collection Time: 03/29/21 10:03 AM   Specimen: BLOOD  Result Value Ref Range Status   Specimen Description BLOOD Blood Culture adequate volume  Final   Special Requests   Final    BOTTLES DRAWN AEROBIC AND ANAEROBIC BLOOD LEFT HAND   Culture   Final    NO GROWTH 4 DAYS Performed at [facility], [address] [place], [place] [address]    Report Status PENDING  Incomplete  Blood culture (routine x 2)     Status: None (Preliminary result)   Collection Time: 03/29/21 10:03 AM   Specimen: BLOOD  Result Value Ref Range Status   Specimen Description BLOOD  Blood Culture adequate volume  Final   Special Requests   Final    BOTTLES DRAWN AEROBIC AND ANAEROBIC RIGHT ANTECUBITAL   Culture   Final    NO GROWTH 4 DAYS Performed at [facility] [address] [place], [place] [address]    Report Status PENDING  Incomplete  Pleural fluid culture w Gram Stain     Status: None   Collection Time: 03/29/21 11:11 AM   Specimen: Pleural Fluid  Result Value Ref Range Status   Specimen Description   Final    PLEURAL Performed at [facility], [address] [place], [place] [address]    Special Requests   Final    NONE Performed at [facility], [address] [place], [place] [address]    Gram Stain   Final    FEW WBC PRESENT, PREDOMINANTLY MONONUCLEAR NO ORGANISMS SEEN Performed at [facility], [address] [place], [place] [address]    Culture RARE STREPTOCOCCUS INTERMEDIUS  Final   Report Status 04/02/2021 FINAL  Final   Organism ID, Bacteria STREPTOCOCCUS INTERMEDIUS  Final      Susceptibility   Streptococcus intermedius - MIC*    PENICILLIN <=0.06 SENSITIVE Sensitive     CEFTRIAXONE  <=0.12 SENSITIVE Sensitive     ERYTHROMYCIN <=0.12 SENSITIVE Sensitive     LEVOFLOXACIN 0.5 SENSITIVE Sensitive     VANCOMYCIN 0.5 SENSITIVE Sensitive     * RARE STREPTOCOCCUS INTERMEDIUS  Acid Fast Smear (AFB)     Status: None   Collection Time: 03/29/21 12:20 PM   Specimen: Pleural, Left; Pleural Fluid  Result Value Ref Range Status   AFB Specimen Processing Concentration  Final   Acid Fast Smear Negative  Final    Comment: (NOTE) Performed At: [facility] [address] [place], [place] [id number] Jolene Schimke MD MV:[id number]    Source (AFB) PLEURAL  Final    Comment: Performed at [facility], [address] [place], [place] [address]  MRSA Next Gen by PCR, Nasal     Status: Abnormal   Collection Time: 03/31/21  4:50 AM   Specimen: Nasal Mucosa; Nasal Swab  Result Value Ref Range Status   MRSA by PCR Next Gen DETECTED (A) NOT DETECTED Final    Comment: RESULT CALLED TO, READ BACK BY AND VERIFIED WITH: Fuller Canada RN [phone] 03/31/21 HNM (NOTE) The GeneXpert MRSA Assay (FDA approved for NASAL specimens only), is one component of a comprehensive MRSA colonization surveillance program. It is not intended to diagnose MRSA infection nor to guide or monitor treatment for MRSA infections. Test performance is not FDA approved in patients less than 98 years old. Performed at [facility], [address] [place], [place] [address]     RADIOLOGY:  CT CHEST WO CONTRAST  Result Date: 04/01/2021 CLINICAL DATA:  Status post left-sided chest tube placement for empyema. Increased bloody output from chest tube after thrombolytic injection into chest tube. EXAM: CT CHEST WITHOUT CONTRAST TECHNIQUE: Multidetector CT imaging of the chest was performed following the standard protocol without IV contrast. COMPARISON:  CT of the chest on 03/29/2021 FINDINGS: Cardiovascular: Normal heart size. No pericardial fluid. Normal caliber of thoracic aorta and central  pulmonary arteries. Mediastinum/Nodes: No evidence enlarged mediastinal or hilar lymph nodes. No abnormal mediastinal masses or evidence of mediastinitis. 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 Lungs/Pleura: Left basilar lower lobe consolidation is consistent with pneumonia. There are some patchy areas probable necrosis at the inferior  left lung base. Posterior left-sided chest tube appears well positioned with substantial reduction in size of the left-sided empyema compared to the pre-drainage CT with small amounts of fluid and air remaining in the pleural space. No significant component of pneumothorax outside of the empyema space. Interval development of a trace right pleural effusion with associated right basilar atelectasis since the prior CT. Upper Abdomen: No acute abnormality. Musculoskeletal: No chest wall mass or suspicious bone lesions identified. IMPRESSION: 1. Substantial reduction in left empyema after chest tube drainage with well-positioned pigtail chest tube in the posterior left pleural space. There remains a small amount of fluid and air in the left pleural space. 2. Left basilar pneumonia which is likely partially necrotic. 3. New trace right pleural effusion with associated right basilar atelectasis. Electronically Signed   By: Irish Lack M.D.   On: 04/01/2021 09:32   DG Chest Port 1 View  Result Date: 04/02/2021 CLINICAL DATA:  Empyema EXAM: PORTABLE CHEST 1 VIEW COMPARISON:  04/01/2021 FINDINGS: Pigtail chest tube in the left lung base unchanged. Mild loculated left pleural effusion and left lower lobe airspace disease unchanged. No pneumothorax Right lung remains clear. IMPRESSION: No interval change.  No pneumothorax. Electronically Signed   By: Marlan Palau M.D.   On: 04/02/2021 09:04   DG Chest Port 1 View  Result Date: 04/01/2021 CLINICAL DATA:  20 year old male with empyema status post chest tube placement on 03/29/2021. EXAM: PORTABLE CHEST 1 VIEW  COMPARISON:  03/31/2021 and earlier. FINDINGS: Portable AP upright view at 0354 hours. Pigtail type left chest tube remains in place, configuration stable from yesterday. Residual peripheral and basilar pleural density. But resolved small left pneumothorax since yesterday. Left lung base opacity continues to obscure portion of the left hemidiaphragm. Stable streaky left perihilar opacity. Stable cardiac size and mediastinal contours. Visualized tracheal air column is within normal limits. Negative right lung. No acute osseous abnormality identified. Negative visible bowel gas. IMPRESSION: 1. Stable left chest tube. Resolved small left pneumothorax. 2. Residual left pleural thickening and/or effusion with patchy left lung opacity. 3. No new cardiopulmonary abnormality. Electronically Signed   By: Odessa Fleming M.D.   On: 04/01/2021 04:14      Management plans discussed with the patient, family and they are in agreement.  CODE STATUS:     Code Status Orders  (From admission, onward)           Start     Ordered   03/29/21 1117  Full code  Continuous        03/29/21 1117           Code Status History     This patient has a current code status but no historical code status.       TOTAL TIME TAKING CARE OF THIS PATIENT: 32 minutes.    Alford Highland M.D on 04/02/2021 at 5:12 PM  [facility]  CC: Primary care physician; Pcp, No

## 2021-04-03 LAB — CULTURE, BLOOD (ROUTINE X 2)
Culture: NO GROWTH
Culture: NO GROWTH
Specimen Description: ADEQUATE
Specimen Description: ADEQUATE

## 2021-04-21 ENCOUNTER — Inpatient Hospital Stay: Payer: Medicaid Other | Admitting: Infectious Diseases

## 2021-05-06 LAB — MISC LABCORP TEST (SEND OUT)
LabCorp test name: 5367
Labcorp test code: 9985

## 2021-05-19 LAB — ACID FAST CULTURE WITH REFLEXED SENSITIVITIES (MYCOBACTERIA): Acid Fast Culture: NEGATIVE

## 2021-09-28 IMAGING — CR DG CHEST 2V
1 series · 2 of 2 positions shown · non-contrast
Comparison: 01/12/2020.

CLINICAL DATA: Chest pain.  Shortness of breath.

EXAM:
CHEST - 2 VIEW

[Series 1: w chest pa · 0.14mm/px · 2 of 2 slices shown]
[im 1/2]
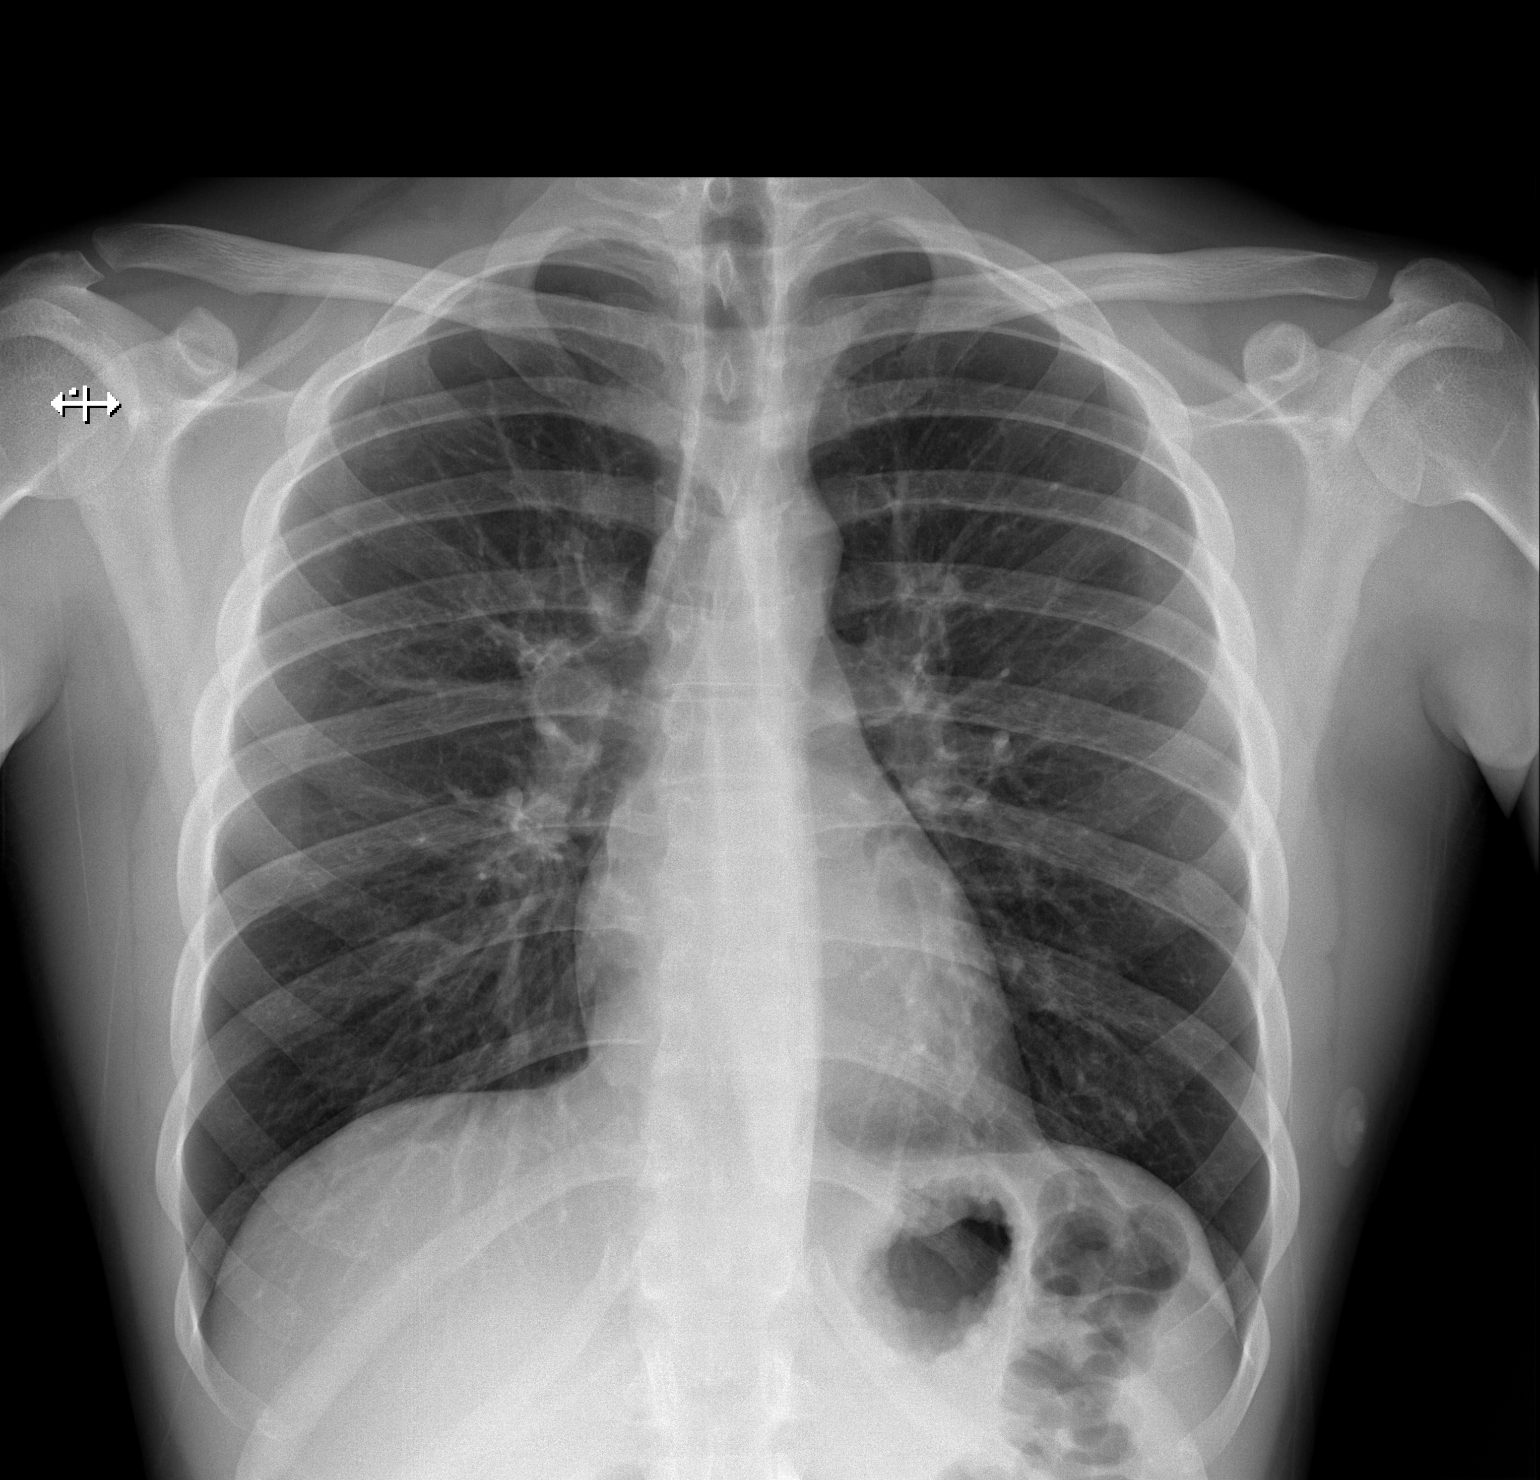
[im 2/2]
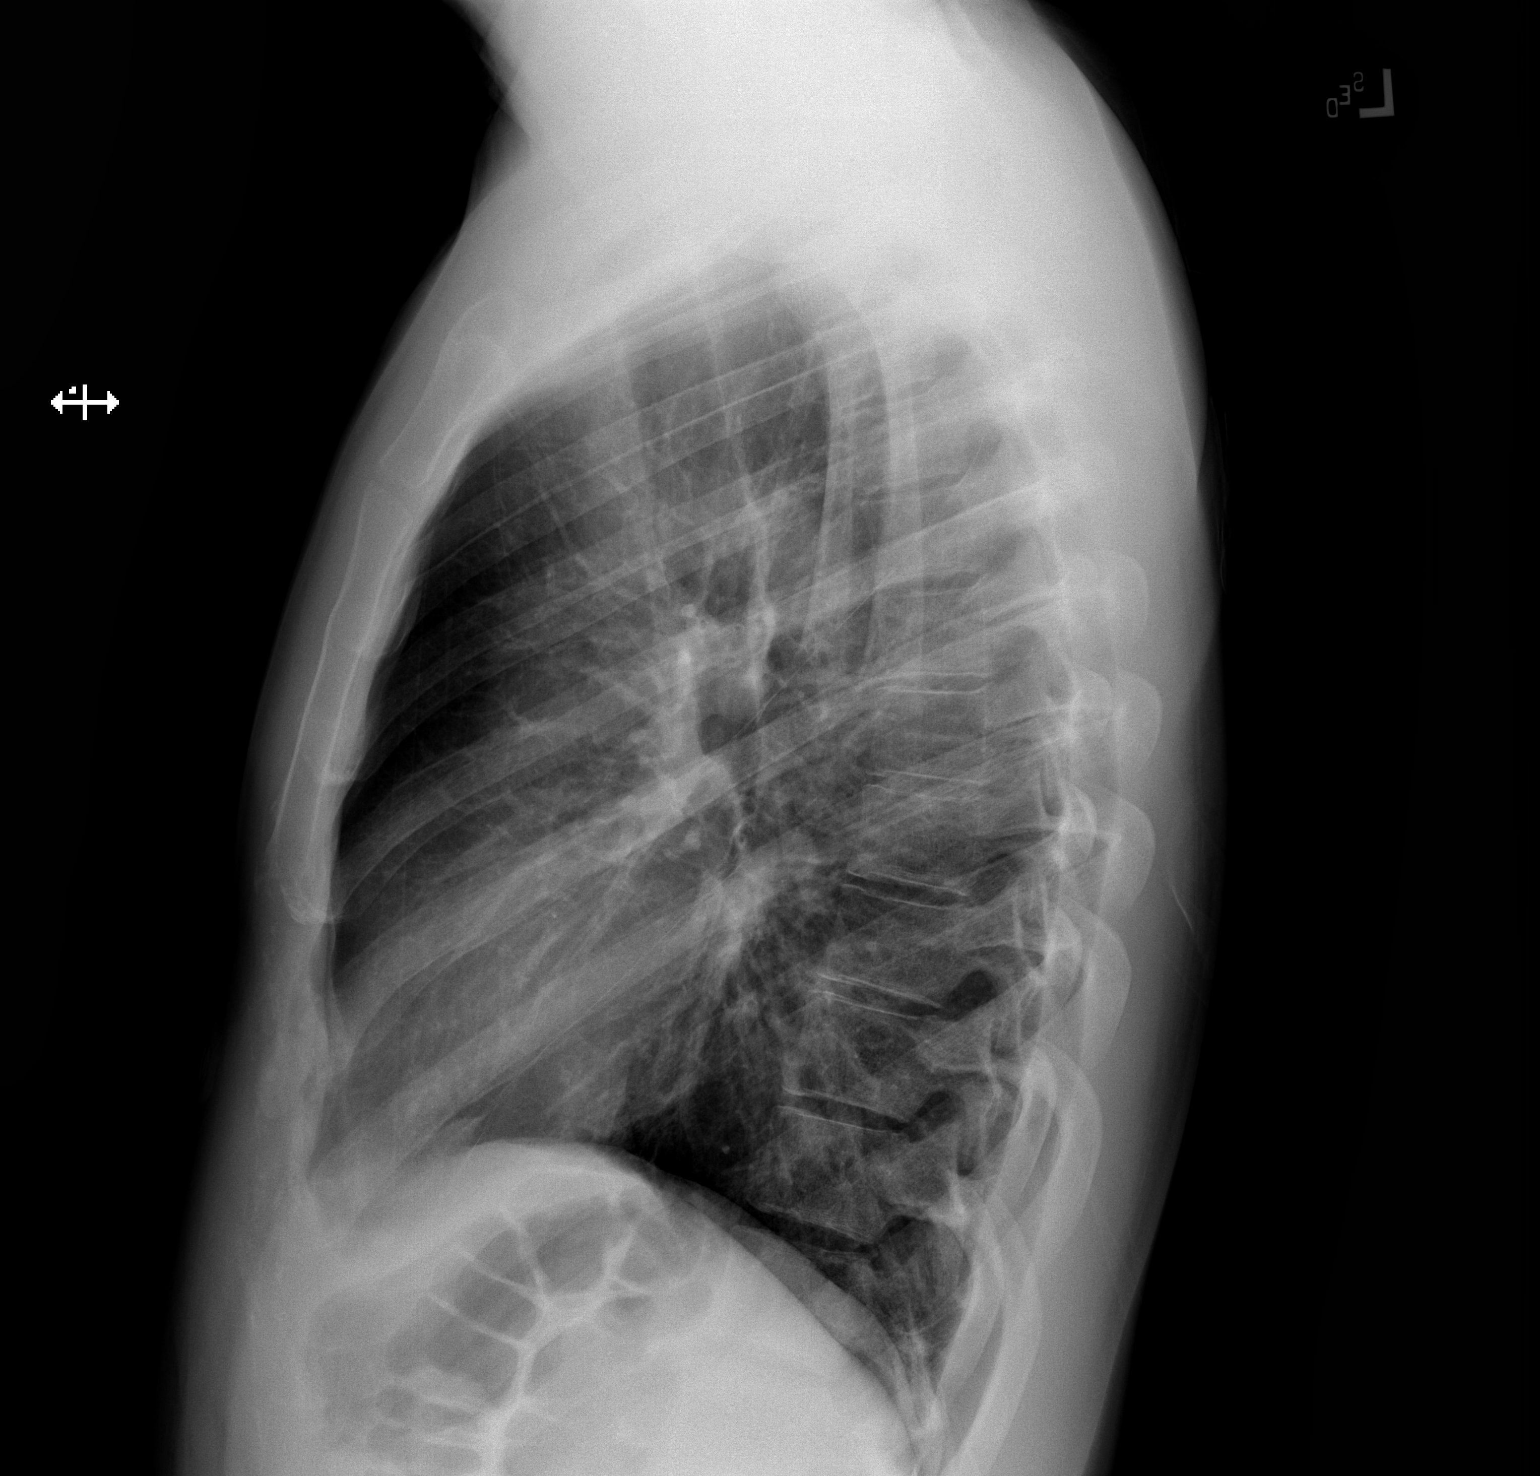

[2 of 2 positions shown; findings below may reference images not displayed]

FINDINGS: The heart size and mediastinal contours are within normal limits.
Both lungs are clear. No visible pleural effusions or pneumothorax.
The visualized skeletal structures are unremarkable.
IMPRESSION: No active cardiopulmonary disease.

## 2022-10-22 IMAGING — DX DG CHEST 1V PORT
1 series · 1 of 1 positions shown · non-contrast
Comparison: 03/31/2021 and earlier.

CLINICAL DATA: 20-year-old male with empyema status post chest tube
placement on 03/29/2021.

EXAM:
PORTABLE CHEST 1 VIEW

[chest ap]
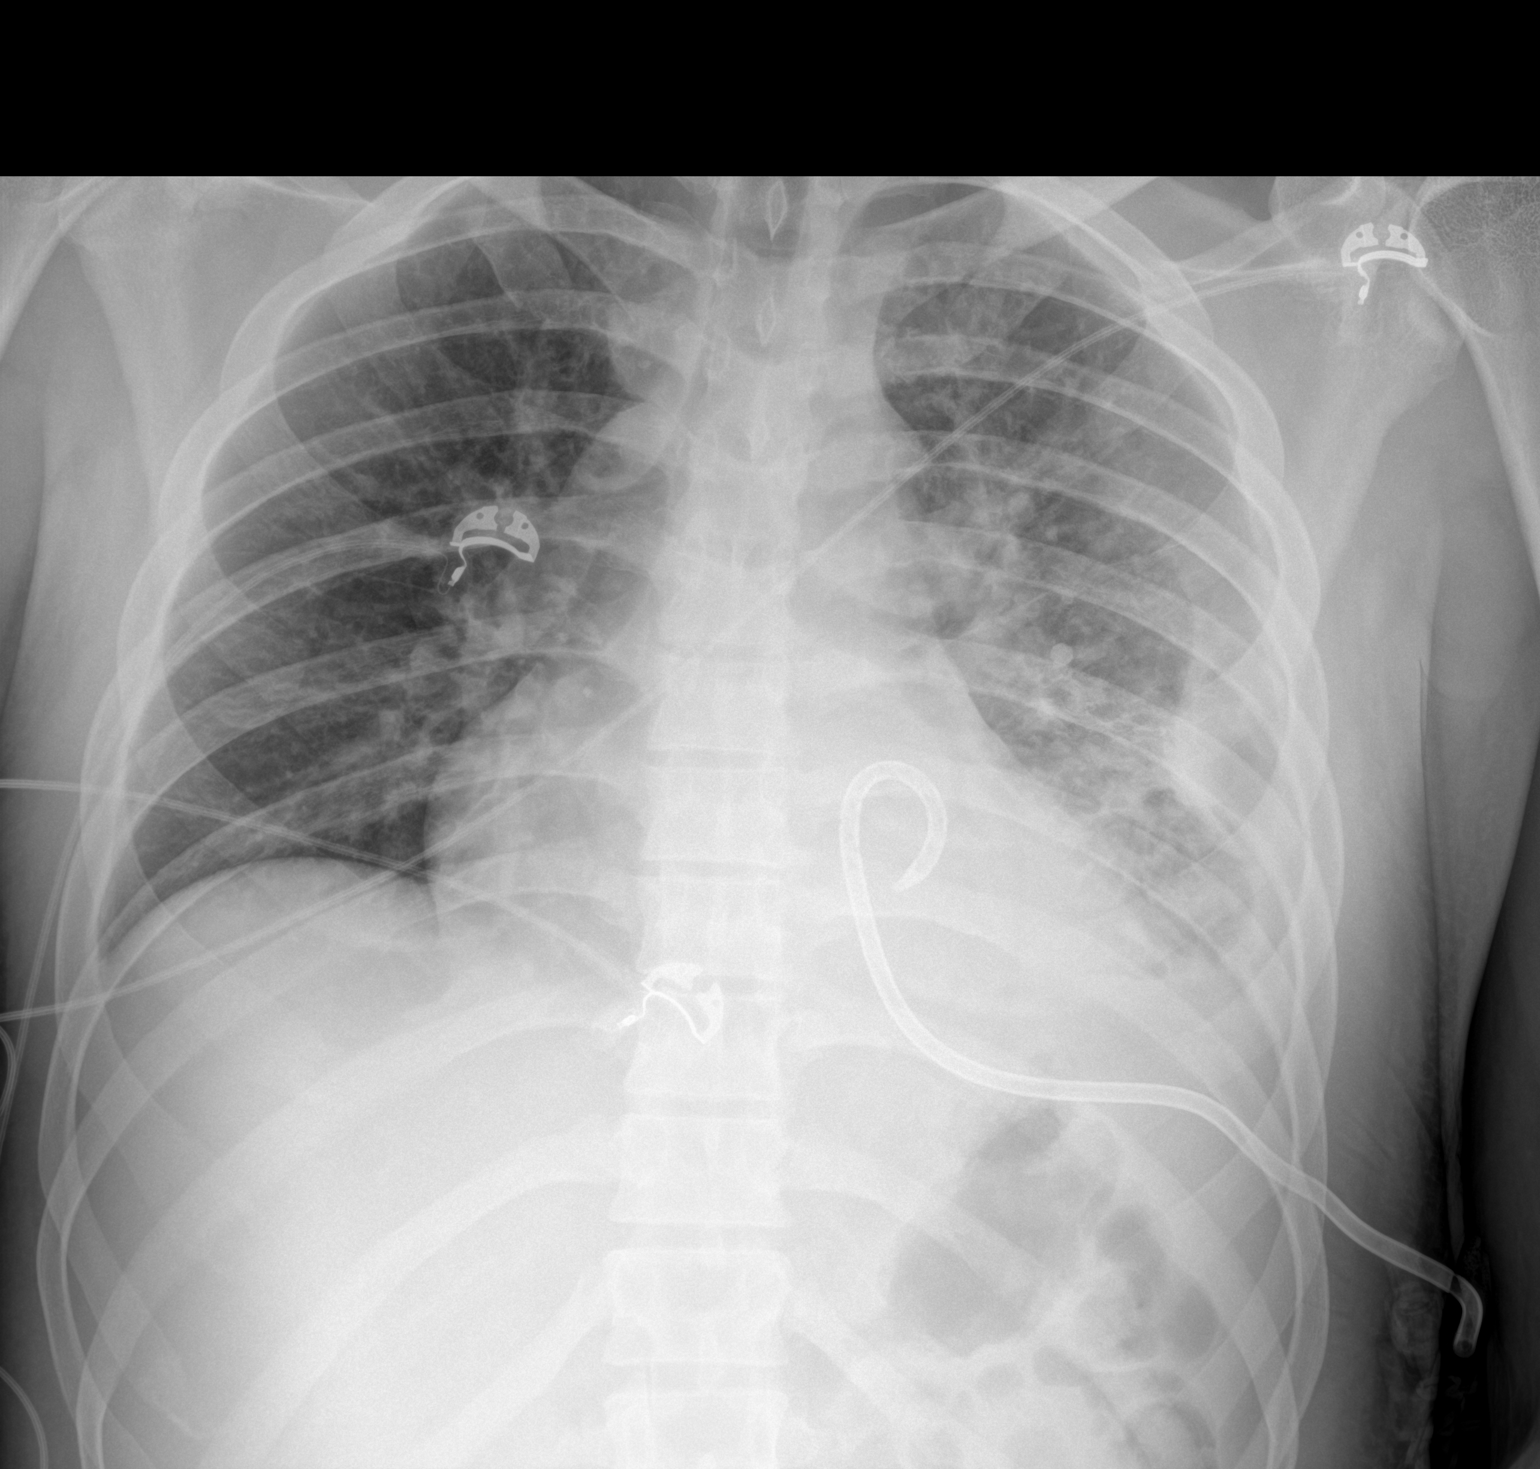

[1 of 1 positions shown; findings below may reference images not displayed]

FINDINGS: Portable AP upright view at 1019 hours. Pigtail type left chest tube
remains in place, configuration stable from yesterday. Residual
peripheral and basilar pleural density. But resolved small left
pneumothorax since yesterday. Left lung base opacity continues to
obscure portion of the left hemidiaphragm. Stable streaky left
perihilar opacity. Stable cardiac size and mediastinal contours.
Visualized tracheal air column is within normal limits. Negative
right lung. No acute osseous abnormality identified. Negative
visible bowel gas.
IMPRESSION: 1. Stable left chest tube. Resolved small left pneumothorax.
2. Residual left pleural thickening and/or effusion with patchy left
lung opacity.
3. No new cardiopulmonary abnormality.

## 2022-10-22 IMAGING — CT CT CHEST W/O CM
2 of 4 series · 15 of 36 positions shown, 18 images · non-contrast
Comparison: CT of the chest on 03/29/2021

CLINICAL DATA: Status post left-sided chest tube placement for
empyema. Increased bloody output from chest tube after thrombolytic
injection into chest tube.

EXAM:
CT CHEST WITHOUT CONTRAST
TECHNIQUE: Multidetector CT imaging of the chest was performed following the
standard protocol without IV contrast.

[Series 2: thorax · axial · 0.67mm/px · z∈[-302,-42]mm · 12 of 154 slices shown, 15 images]
[im 12/154  mediastinal]
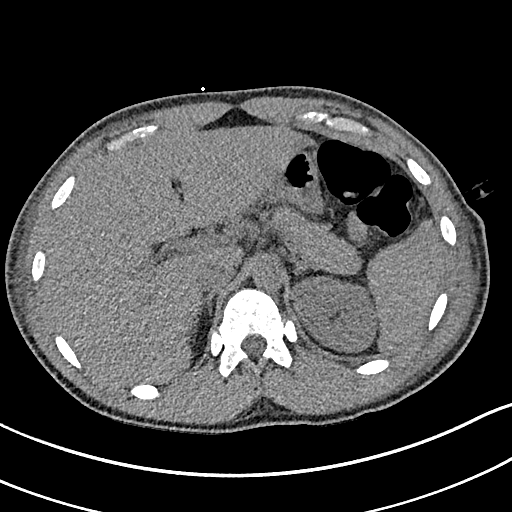
[im 12/154  lung]
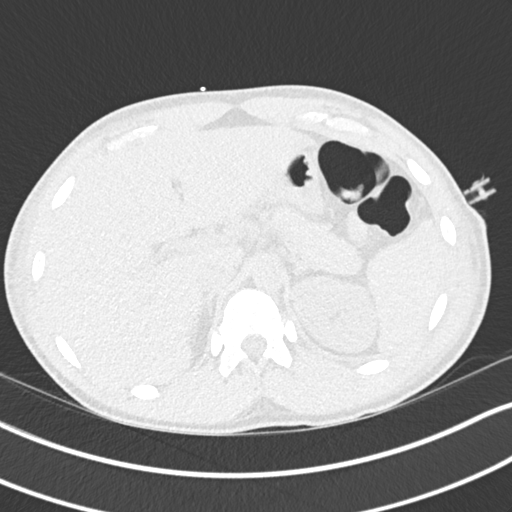
[im 24/154  lung]
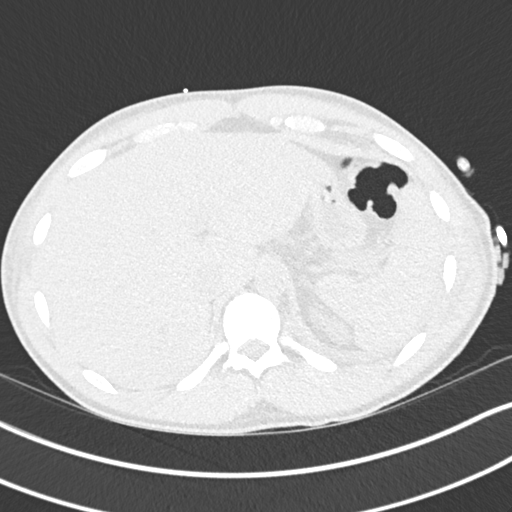
[im 36/154  lung]
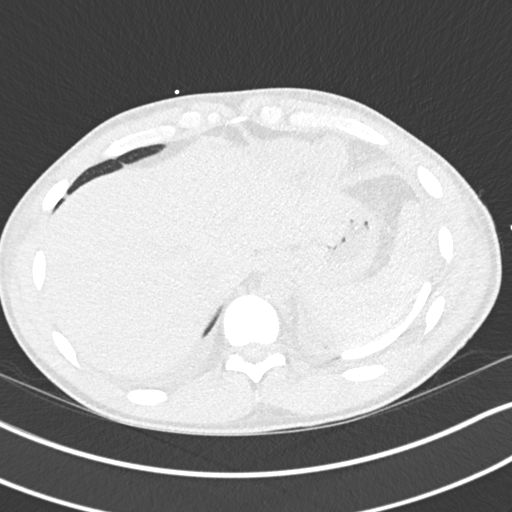
[im 48/154  lung]
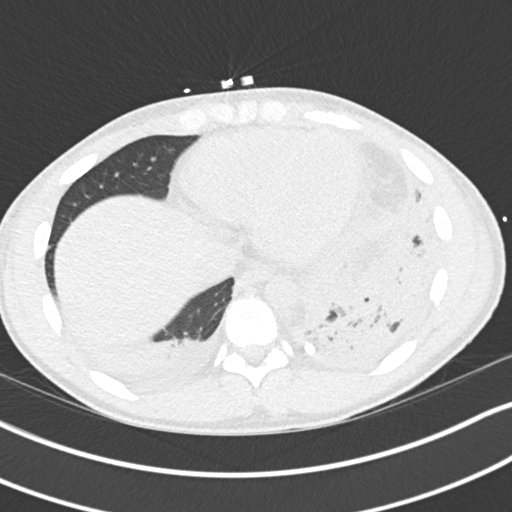
[im 59/154  mediastinal]
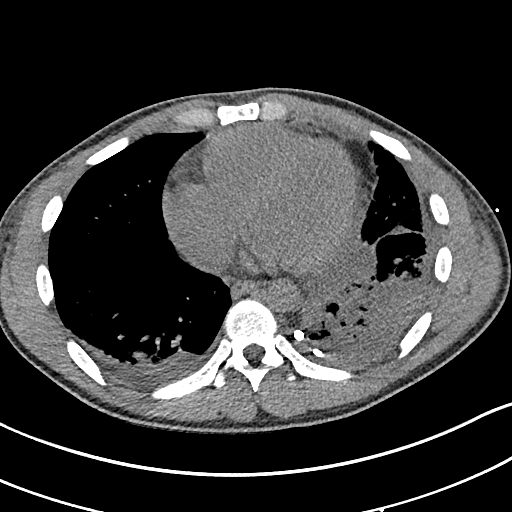
[im 59/154  lung]
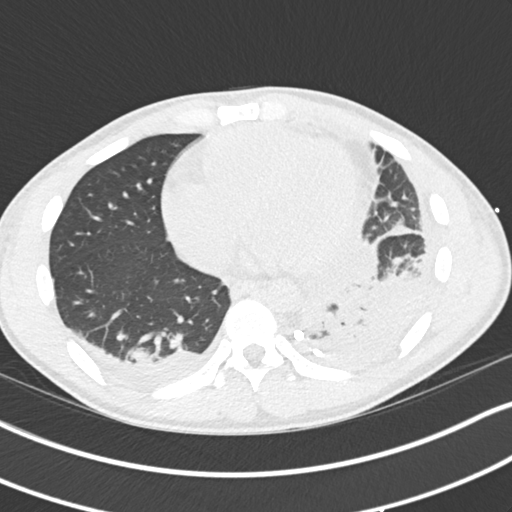
[im 71/154  lung]
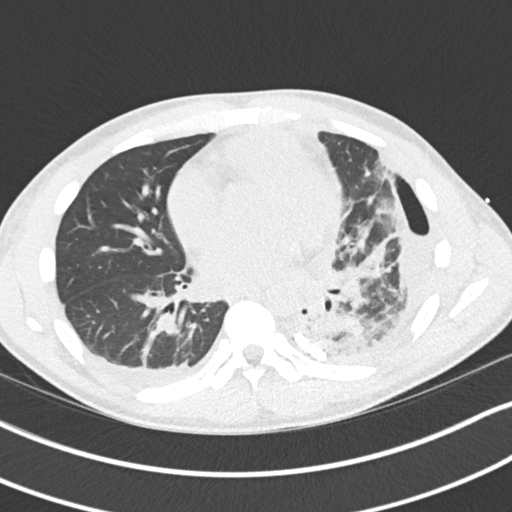
[im 83/154  lung]
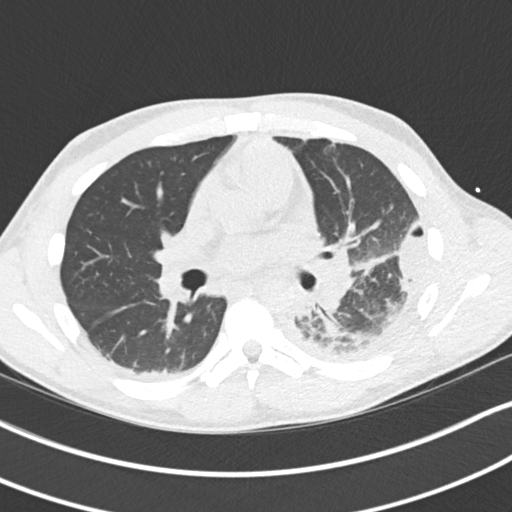
[im 95/154  lung]
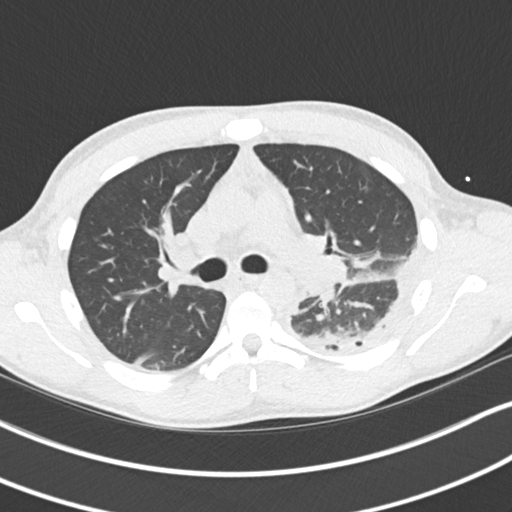
[im 106/154  mediastinal]
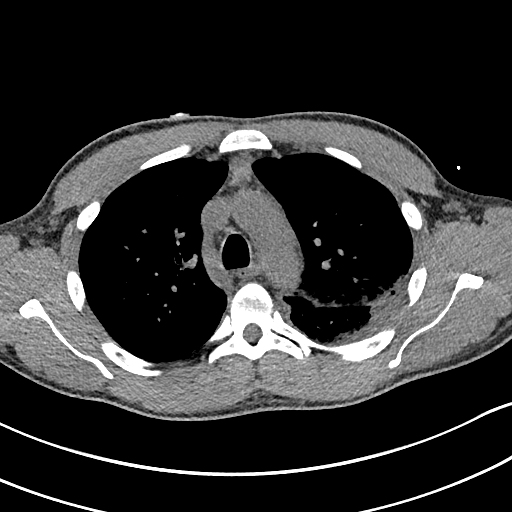
[im 106/154  lung]
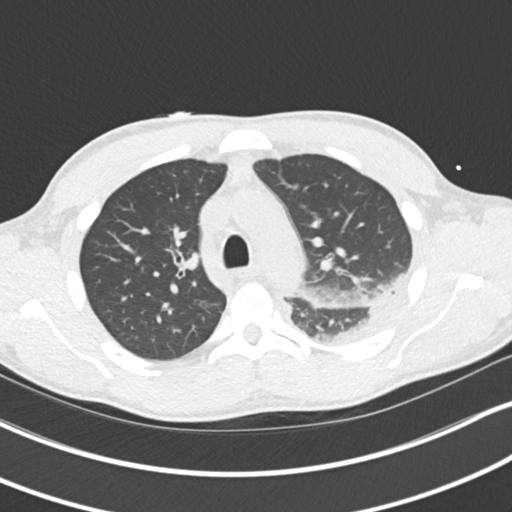
[im 118/154  lung]
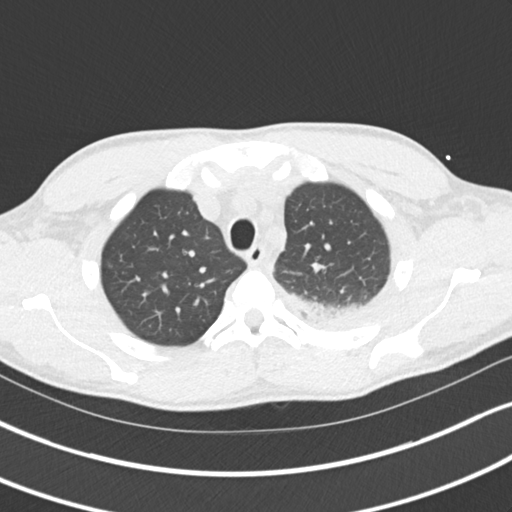
[im 130/154  lung]
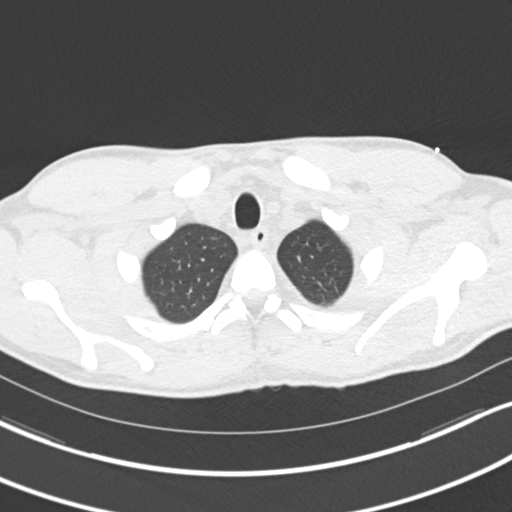
[im 142/154  lung]
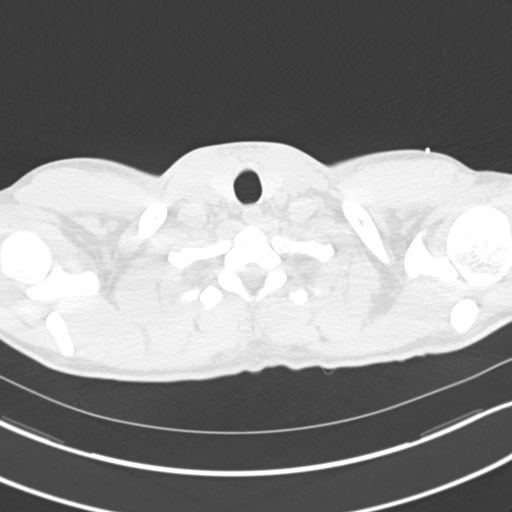

[Series 5: coronal · coronal · 0.61mm/px · 3 of 117 slices shown]
[im 24/117  lung]
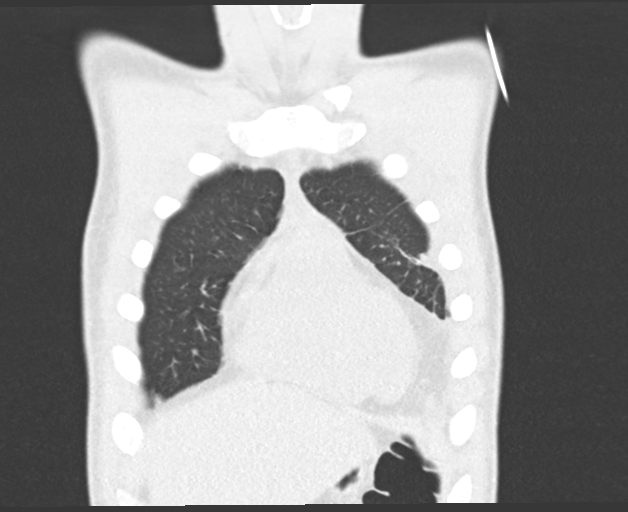
[im 47/117  lung]
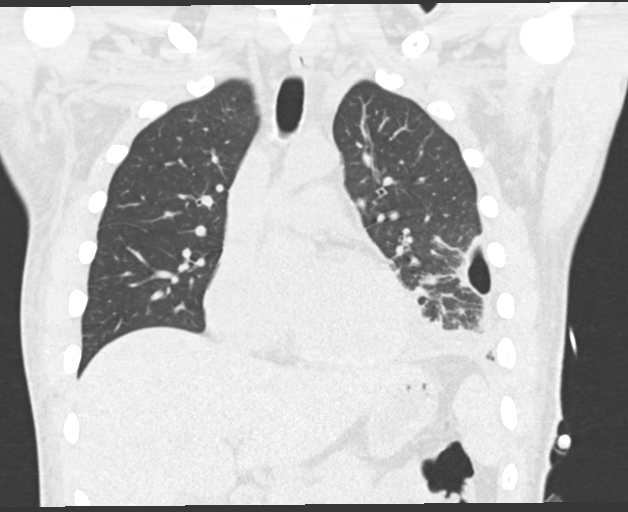
[im 70/117  lung]
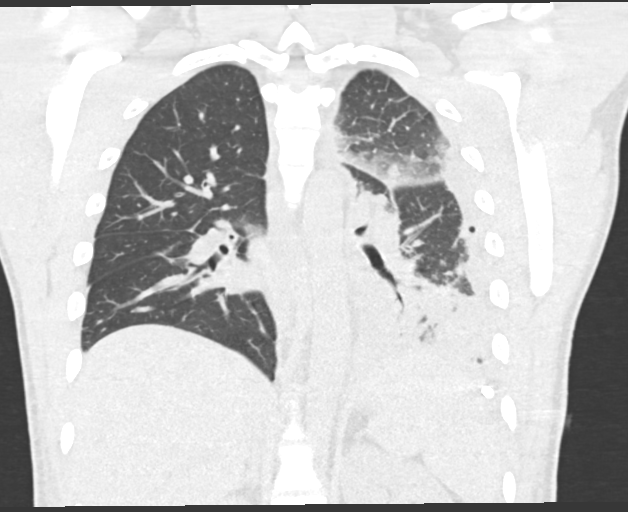

[15 of 36 positions shown; findings below may reference images not displayed]

FINDINGS: Cardiovascular: Normal heart size. No pericardial fluid. Normal
caliber of thoracic aorta and central pulmonary arteries.

Mediastinum/Nodes: No evidence enlarged mediastinal or hilar lymph
nodes. No abnormal mediastinal masses or evidence of mediastinitis.
Stable thymic remnant tissue in the anterior mediastinum.

Lungs/Pleura: Left basilar lower lobe consolidation is consistent
with pneumonia. There are some patchy areas probable necrosis at the
inferior left lung base. Posterior left-sided chest tube appears
well positioned with substantial reduction in size of the left-sided
empyema compared to the pre-drainage CT with small amounts of fluid
and air remaining in the pleural space. No significant component of
pneumothorax outside of the empyema space. Interval development of a
trace right pleural effusion with associated right basilar
atelectasis since the prior CT.

Upper Abdomen: No acute abnormality.

Musculoskeletal: No chest wall mass or suspicious bone lesions
identified.
IMPRESSION: 1. Substantial reduction in left empyema after chest tube drainage
with well-positioned pigtail chest tube in the posterior left
pleural space. There remains a small amount of fluid and air in the
left pleural space.
2. Left basilar pneumonia which is likely partially necrotic.
3. New trace right pleural effusion with associated right basilar
atelectasis.

## 2023-08-03 ENCOUNTER — Encounter: Payer: Self-pay | Admitting: Emergency Medicine

## 2023-08-03 ENCOUNTER — Other Ambulatory Visit: Payer: Self-pay

## 2023-08-03 ENCOUNTER — Emergency Department
Admission: EM | Admit: 2023-08-03 | Discharge: 2023-08-03 | Disposition: A | Discharge status: Home / Self Care | Payer: MEDICAID | Attending: Emergency Medicine | Admitting: Emergency Medicine

## 2023-08-03 DIAGNOSIS — W228XXA Striking against or struck by other objects, initial encounter: Secondary | ICD-10-CM | POA: Insufficient documentation

## 2023-08-03 DIAGNOSIS — S0501XA Injury of conjunctiva and corneal abrasion without foreign body, right eye, initial encounter: Secondary | ICD-10-CM | POA: Insufficient documentation

## 2023-08-03 DIAGNOSIS — J45909 Unspecified asthma, uncomplicated: Secondary | ICD-10-CM | POA: Diagnosis not present

## 2023-08-03 DIAGNOSIS — Y9389 Activity, other specified: Secondary | ICD-10-CM | POA: Diagnosis not present

## 2023-08-03 DIAGNOSIS — S0591XA Unspecified injury of right eye and orbit, initial encounter: Secondary | ICD-10-CM | POA: Diagnosis present

## 2023-08-03 MED ORDER — TETRACAINE HCL 0.5 % OP SOLN: 2.0000 [drp] | Freq: Once | OPHTHALMIC | Status: DC

## 2023-08-03 MED ORDER — OFLOXACIN 0.3 % OP SOLN: 2.0000 [drp] | Freq: Four times a day (QID) | OPHTHALMIC | 0 refills | Status: AC

## 2023-08-03 MED ORDER — FLUORESCEIN SODIUM 1 MG OP STRP: 1.0000 | ORAL_STRIP | Freq: Once | OPHTHALMIC | Status: DC

## 2023-08-03 MED ORDER — TETRACAINE HCL 0.5 % OP SOLN
2.0000 [drp] | Freq: Once | OPHTHALMIC | Status: AC
  Administered 2023-08-03: 2 [drp] via OPHTHALMIC
  Filled 2023-08-03: qty 4

## 2023-08-03 MED ORDER — FLUORESCEIN SODIUM 1 MG OP STRP
1.0000 | ORAL_STRIP | Freq: Once | OPHTHALMIC | Status: AC
  Administered 2023-08-03: 1 via OPHTHALMIC
  Filled 2023-08-03: qty 1

## 2023-08-03 NOTE — ED Triage Notes (Signed)
 Pt in with R eye pain and irritation since yesterday. Pt states he was mowing yesterday, and a rock struck his R eye, was not wearing protective eyewear. Pt endorses HA and R eye blurred vision. Eye appears red

## 2023-08-03 NOTE — ED Provider Notes (Signed)
 [facility] Provider Note    Event Date/Time   First MD Initiated Contact with Patient 08/03/23 1553     (approximate)   History   Eye Problem   HPI  Connor Williams is a 23 y.o. male with PMH of asthma who presents for evaluation of right eye pain and irritation since yesterday.  Patient states he was smelling and a rock hit him in the right eye.  He reports that his vision is normally perfect but he has had some blurry vision in the right eye since.  No drainage.  He does not wear contacts or glasses.      Physical Exam   Triage Vital Signs: ED Triage Vitals  Encounter Vitals Group     BP 08/03/23 1522 133/87     Systolic BP Percentile --      Diastolic BP Percentile --      Pulse Rate 08/03/23 1522 68     Resp 08/03/23 1522 18     Temp 08/03/23 1522 98.2 F (36.8 C)     Temp Source 08/03/23 1522 Oral     SpO2 08/03/23 1522 97 %     Weight 08/03/23 1523 167 lb 8.8 oz (76 kg)     Height 08/03/23 1603 5\' 5"  (1.651 m)     Head Circumference --      Peak Flow --      Pain Score 08/03/23 1522 5     Pain Loc --      Pain Education --      Exclude from Growth Chart --     Most recent vital signs: Vitals:   08/03/23 1522  BP: 133/87  Pulse: 68  Resp: 18  Temp: 98.2 F (36.8 C)  SpO2: 97%   General: Awake, no distress.  CV:  Good peripheral perfusion.  Resp:  Normal effort.  Abd:  No distention.  Other:  PERRL, EOM intact, no conjunctival injection or drainage from the right eye.   ED Results / Procedures / Treatments   Labs (all labs ordered are listed, but only abnormal results are displayed) Labs Reviewed - No data to display   PROCEDURES:  Critical Care performed: No  Procedures   MEDICATIONS ORDERED IN ED: Medications  tetracaine (PONTOCAINE) 0.5 % ophthalmic solution 2 drop (2 drops Right Eye Not Given 08/03/23 1606)  fluorescein ophthalmic strip 1 strip (1 strip Right Eye Not Given 08/03/23 1606)  tetracaine  (PONTOCAINE) 0.5 % ophthalmic solution 2 drop (2 drops Right Eye Given 08/03/23 1600)  fluorescein ophthalmic strip 1 strip (1 strip Right Eye Given 08/03/23 1600)     IMPRESSION / MDM / ASSESSMENT AND PLAN / ED COURSE  I reviewed the triage vital signs and the nursing notes.                             23 year old male presents for evaluation of right eye pain.  Vital signs are stable patient NAD on exam.  Differential diagnosis includes, but is not limited to, corneal abrasion, corneal ulceration, conjunctivitis.  Patient's presentation is most consistent with acute, uncomplicated illness.  Bedside fluorescein dye exam performed which showed small amount of dye uptake at about 6:00 on the border of the iris and sclera.  This is consistent with a corneal abrasion.  Will start patient on antibiotic eyedrops.  Recommended that he follow-up with ophthalmology.  He voiced understanding, all questions were answered and he  was stable at discharge.      FINAL CLINICAL IMPRESSION(S) / ED DIAGNOSES   Final diagnoses:  Abrasion of right cornea, initial encounter     Rx / DC Orders   ED Discharge Orders          Ordered    ofloxacin (OCUFLOX) 0.3 % ophthalmic solution  4 times daily        08/03/23 1625             Note:  This document was prepared using Dragon voice recognition software and may include unintentional dictation errors.   Cameron Ali, PA-C 08/03/23 1626    Sharman Cheek, MD 08/03/23 [phone]

## 2023-08-03 NOTE — Discharge Instructions (Signed)
 Please use the antibiotic eye drops as prescribed. Schedule a follow up appoitment with ophthalmology for next week. Their information is attached. Return to the ED with any worsening symptoms.

## 2023-08-03 NOTE — ED Notes (Signed)
 See triage note   Presents with pain to right eye  States he was hit in the right eye by a rock while doing yard work yesterday
